# Patient Record
Sex: Female | Born: 1970
Health system: Southern US, Community
[De-identification: ages and names within clinical notes are randomized; demographics above are authoritative.]

## PROBLEM LIST (undated history)

## (undated) DIAGNOSIS — E282 Polycystic ovarian syndrome: Secondary | ICD-10-CM

## (undated) DIAGNOSIS — T7840XA Allergy, unspecified, initial encounter: Secondary | ICD-10-CM

## (undated) DIAGNOSIS — N83209 Unspecified ovarian cyst, unspecified side: Secondary | ICD-10-CM

## (undated) DIAGNOSIS — N946 Dysmenorrhea, unspecified: Secondary | ICD-10-CM

## (undated) HISTORY — DX: Allergy, unspecified, initial encounter: T78.40XA

## (undated) HISTORY — DX: Dysmenorrhea, unspecified: N94.6

## (undated) HISTORY — DX: Unspecified ovarian cyst, unspecified side: N83.209

## (undated) HISTORY — DX: Polycystic ovarian syndrome: E28.2

---

## 1995-07-21 HISTORY — PX: LAPAROSCOPIC ENDOMETRIOSIS FULGURATION: SUR769

## 2006-02-23 ENCOUNTER — Other Ambulatory Visit: Admission: RE | Admit: 2006-02-23 | Discharge: 2006-02-23 | Payer: Self-pay | Admitting: Obstetrics and Gynecology

## 2006-03-17 ENCOUNTER — Ambulatory Visit: Payer: Self-pay | Admitting: Family Medicine

## 2006-04-12 ENCOUNTER — Emergency Department (HOSPITAL_COMMUNITY): Admission: EM | Admit: 2006-04-12 | Discharge: 2006-04-12 | Payer: Self-pay | Admitting: Family Medicine

## 2006-04-25 ENCOUNTER — Emergency Department (HOSPITAL_COMMUNITY): Admission: EM | Admit: 2006-04-25 | Discharge: 2006-04-25 | Payer: Self-pay | Admitting: Family Medicine

## 2007-01-25 ENCOUNTER — Emergency Department (HOSPITAL_COMMUNITY): Admission: EM | Admit: 2007-01-25 | Discharge: 2007-01-25 | Payer: Self-pay | Admitting: Emergency Medicine

## 2007-08-14 ENCOUNTER — Emergency Department (HOSPITAL_COMMUNITY): Admission: EM | Admit: 2007-08-14 | Discharge: 2007-08-14 | Payer: Self-pay | Admitting: Family Medicine

## 2007-08-19 ENCOUNTER — Emergency Department (HOSPITAL_COMMUNITY): Admission: EM | Admit: 2007-08-19 | Discharge: 2007-08-19 | Payer: Self-pay | Admitting: Emergency Medicine

## 2007-08-31 ENCOUNTER — Encounter: Payer: Self-pay | Admitting: Internal Medicine

## 2007-09-23 ENCOUNTER — Ambulatory Visit: Payer: Self-pay | Admitting: Internal Medicine

## 2007-09-23 DIAGNOSIS — N83209 Unspecified ovarian cyst, unspecified side: Secondary | ICD-10-CM | POA: Insufficient documentation

## 2007-09-23 DIAGNOSIS — E782 Mixed hyperlipidemia: Secondary | ICD-10-CM | POA: Insufficient documentation

## 2007-09-23 DIAGNOSIS — N809 Endometriosis, unspecified: Secondary | ICD-10-CM | POA: Insufficient documentation

## 2007-09-23 DIAGNOSIS — E669 Obesity, unspecified: Secondary | ICD-10-CM | POA: Insufficient documentation

## 2007-09-23 LAB — CONVERTED CEMR LAB
ALT: 31 units/L (ref 0–35)
AST: 24 units/L (ref 0–37)
Basophils Relative: 0.3 % (ref 0.0–1.0)
Bilirubin, Direct: 0.2 mg/dL (ref 0.0–0.3)
CO2: 26 meq/L (ref 19–32)
Chloride: 109 meq/L (ref 96–112)
Creatinine, Ser: 0.8 mg/dL (ref 0.4–1.2)
Direct LDL: 165.2 mg/dL
Eosinophils Absolute: 0.1 10*3/uL (ref 0.0–0.6)
Eosinophils Relative: 1.2 % (ref 0.0–5.0)
Glucose, Bld: 79 mg/dL (ref 70–99)
Glucose, Urine, Semiquant: NEGATIVE
HCT: 44.4 % (ref 36.0–46.0)
MCV: 86.5 fL (ref 78.0–100.0)
Neutrophils Relative %: 61.5 % (ref 43.0–77.0)
Nitrite: NEGATIVE
RBC: 5.13 M/uL — ABNORMAL HIGH (ref 3.87–5.11)
Sodium: 141 meq/L (ref 135–145)
Specific Gravity, Urine: >=1.03
Total Bilirubin: 0.8 mg/dL (ref 0.3–1.2)
Total Protein: 6.9 g/dL (ref 6.0–8.3)
Triglycerides: 122 mg/dL (ref 0–149)
VLDL: 24 mg/dL (ref 0–40)
WBC: 9.6 10*3/uL (ref 4.5–10.5)

## 2007-10-04 ENCOUNTER — Encounter: Payer: Self-pay | Admitting: Internal Medicine

## 2007-10-27 ENCOUNTER — Ambulatory Visit: Payer: Self-pay | Admitting: Internal Medicine

## 2008-12-01 ENCOUNTER — Emergency Department (HOSPITAL_COMMUNITY): Admission: EM | Admit: 2008-12-01 | Discharge: 2008-12-01 | Payer: Self-pay | Admitting: Family Medicine

## 2009-11-19 ENCOUNTER — Emergency Department (HOSPITAL_COMMUNITY): Admission: EM | Admit: 2009-11-19 | Discharge: 2009-11-19 | Payer: Self-pay | Admitting: Emergency Medicine

## 2009-12-08 ENCOUNTER — Emergency Department (HOSPITAL_COMMUNITY): Admission: EM | Admit: 2009-12-08 | Discharge: 2009-12-08 | Payer: Self-pay | Admitting: Family Medicine

## 2010-08-13 ENCOUNTER — Ambulatory Visit (HOSPITAL_COMMUNITY)
Admission: RE | Admit: 2010-08-13 | Discharge: 2010-08-13 | Payer: Self-pay | Source: Home / Self Care | Attending: Family Medicine | Admitting: Family Medicine

## 2010-12-05 NOTE — Assessment & Plan Note (Signed)
[facility]                             [facility] [facility] OFFICE NOTE   NAME:[place], [place] Franklin                       MRN:          [id number]  DATE:03/17/2006                            DOB:          March 01, 1971    CHIEF COMPLAINT:  A 40 year old, white female here to establish new  physician.   HISTORY OF PRESENT ILLNESS:  Problem 1.  Chest pain:  [place] states that  she has been having chest pressure and tightness for approximately 1 year  off an on.  The pain, when it is occuring, is constant and is kind of deep  sustained ache that feels like she has one of her cats sitting on her chest.  She states that eating spicy foods and [place] food makes it worse.  Sometimes she has an increase with exercise.  There is no change with lying  flat.  Heat can sometimes help her chest pain.  She denies anxiety and has  occasionally been under stress.  She did move seven months ago from  [place] and has a new job.  When she has some of her chest pain, she has  some shortness of breath and nausea.  Prilosec over-the-counter did help  temporarily, but is not helping any longer.  She has not been on it for  approximately 1 week.   Problem 2.  Sore throat x1 week:  She states she has been having problems  with irritated throat and sneezing over the past few weeks.  She states  these are minor symptoms and she does not mind treating them over-the-  counter temporarily.   PAST MEDICAL HISTORY:  1. Endometriosis.  2. Ovarian cysts.  3. Hypoglycemia.  4. Wrist, ankle and knee pain.   PAST SURGICAL HISTORY:  1. In 1998, exploratory laparoscopy for endometriosis lasering.  2. Pap smear in July 2007, normal.  3. Tetanus in April 2007.   ALLERGIES:  No known drug allergies.   MEDICATIONS:  1. Generic Mircette daily.  2. Women's best friend two tablets daily.  3. Prilosec over-the-counter 20 mg daily.   REVIEW OF SYSTEMS:  She does have chronic bilateral  ankle, knee and right  wrist pain.  She states she has had injuries in all of these joints and  feels that the pain may be osteoarthritis in these areas.   FAMILY HISTORY:  Father alive at age 44, healthy.  Mother alive at age 77  with rheumatoid and osteoarthritis as well as diabetes.  She has no brothers  or sisters.  There is no family history of breast, ovarian, colon or uterine  cancer.  She does have a strong family history for high blood pressure,  diabetes and coronary artery disease on both sides.   SOCIAL HISTORY:  She works as a Nurse, learning disability.  She is single and not  currently in a relationship or sexually active.  She has no children.  She  does not get regular exercise currently, but is trying to increase her  exercise once she gets settled.  She has had a 30 pound weight gain over  the  past year.   NUTRITION:  She eats three to four meals daily including fruits and  vegetables and only occasional fast food.   PHYSICAL EXAMINATION:  VITAL SIGNS:  Height 5 feet 3.75 inches, weight 230  making BMI 40, blood pressure 111/68, pulse 55, temperature 98.0.  GENERAL:  Obese-appearing female in no apparent distress.  HEENT:  PERRLA.  Extraocular movements intact.  Tympanic membranes clear.  Oropharynx clear.  No thyromegaly.  No lymphadenopathy.  CARDIAC:  Regular rate and rhythm with no murmurs, rubs or gallops.  Normal  PMI, 2+ pulses peripherally.  CHEST:  Tenderness to palpation over lower portion of sternum and along  sternal borders.  PULMONARY:  Clear to auscultation bilaterally, no wheezes, rales or rhonchi.  ABDOMEN:  Soft, nontender, normoactive bowel sounds.  No hepatosplenomegaly.  PSYCHIATRIC:  Appropriate affect.  No suicidal or homicidal ideation.  Denies depressive symptoms.  MUSCULOSKELETAL:  Strength 5/5 in upper and lower extremities.  NEUROLOGIC:  Alert and oriented x3.  Cranial nerves 2-12 grossly intact.  Reflexes 2+.   ASSESSMENT/PLAN:  1. Chest pain,  acute.  This is most likely noncardiac chest pain.  Given      the fact that her chest wall is tender to palpation, it may be      secondary to costochondritis versus chest wall muscle strain.  She will      use anti-inflammatory over the next few days and p.r.n. pain.  She did      mention during the end of the visit, that Advil had helped with her      pain.  In addition, given her symptoms that sound like she may have      some gastroesophageal reflux disease, I will initiate her on Protonix      40 mg daily.  She will continue this and was given samples of this for      the next few weeks.  If it seems to be working, she can call for a      refill or may try Prilosec over-the-counter 40 mg daily.  If her      symptoms continue, we can consider further workup.  She appears to have      no pulmonary pathology at this point.  I did discuss with her that      losing weight certainly would improve her breathing.  2. Allergic rhinitis.  I feel that her sore throat is most likely      secondary to allergies.  At this point in time, she will use Claritin      over-the-counter and nasal saline.  She will return to clinic following      the receipt of her records to discuss her wrist, ankle and knee pain.                                  Kerby Nora, MD   AB/MedQ  DD:  03/17/2006  DT:  03/18/2006  Job #:  [id number]

## 2011-09-21 ENCOUNTER — Ambulatory Visit (INDEPENDENT_AMBULATORY_CARE_PROVIDER_SITE_OTHER): Payer: 59 | Admitting: Family Medicine

## 2011-09-21 VITALS — BP 140/86 | HR 76 | Temp 98.4°F | Resp 16 | Ht 64.0 in | Wt 226.0 lb

## 2011-09-21 DIAGNOSIS — J329 Chronic sinusitis, unspecified: Secondary | ICD-10-CM

## 2011-09-21 DIAGNOSIS — J019 Acute sinusitis, unspecified: Secondary | ICD-10-CM

## 2011-09-21 MED ORDER — IPRATROPIUM BROMIDE 0.03 % NA SOLN: 2.0000 | Freq: Four times a day (QID) | NASAL | Status: DC

## 2011-09-21 MED ORDER — CEFDINIR 300 MG PO CAPS: 300.0000 mg | ORAL_CAPSULE | Freq: Two times a day (BID) | ORAL | Status: AC

## 2011-09-21 NOTE — Progress Notes (Signed)
  Patient Name: [place] Date of Birth: 09-Sep-1970 Medical Record Number: [id number] Gender: female Date of Encounter: 09/21/2011  History of Present Illness:  [place] is a 41 y.o. very pleasant female patient who presents with the following:  Had a sinus infection about 2 weeks ago- treated at home with OTC supplements and medications.  Was getting better- then had visitors who brought in illness- about a week ago noted stomach pains and diarrhea/ vomiting.  These symptoms persisted for 3 or 4 days and then got better. Fever, N/V and diarrhea resolved.   Then yesterday her sinus symptoms returned with copious nasal discharge and cough.   Using OTC medications.  Has some PND which is causing nausea.  Tends to get bronchitis and pneumonia easily.  No current fever, does have a ST, ears feel congested.  Cough is productive of clear mucus.  Still has sinus pressure and pain, also tooth pain.   No change of pregnancy  Patient Active Problem List  Diagnoses  . HYPERLIPIDEMIA  . OBESITY, UNSPECIFIED  . ENDOMETRIOSIS, SITE UNSPECIFIED  . OVARIAN CYST   Past Medical History  Diagnosis Date  . Allergy   . Endometriosis    Past Surgical History  Procedure Date  . Laparoscopic endometriosis fulguration 1997   History  Substance Use Topics  . Smoking status: Never Smoker   . Smokeless tobacco: Never Used  . Alcohol Use: No   History reviewed. No pertinent family history. Allergies  Allergen Reactions  . Prednisone     Medication list has been reviewed and updated.  Review of Systems: As per HPI- otherwise negative.   Physical Examination: Filed Vitals:   09/21/11 0925  BP: 140/86  Pulse: 76  Temp: 98.4 F (36.9 C)  TempSrc: Oral  Resp: 16  Height: 5\' 4"  (1.626 m)  Weight: 226 lb (102.513 kg)    Body mass index is 38.79 kg/(m^2).  GEN: WDWN, NAD, Non-toxic, A & O x 3, obese HEENT: Atraumatic, Normocephalic. Neck supple. No masses, No LAD.  TM wnl  bilaterally, oropharynx wnl, sinuses congested and tender Ears and Nose: No external deformity. CV: RRR, No M/G/R. No JVD. No thrill. No extra heart sounds. PULM: CTA B, no wheezes, crackles, rhonchi. No retractions. No resp. distress. No accessory muscle use. ABD: S, NT, ND, +BS. No rebound. No HSM. EXTR: No c/c/e NEURO Normal gait.  PSYCH: Normally interactive. Conversant. Not depressed or anxious appearing.  Calm demeanor.    Assessment and Plan: 1. Sinusitis  cefdinir (OMNICEF) 300 MG capsule, ipratropium (ATROVENT) 0.03 % nasal spray    Treat sinusitis as above.

## 2011-12-02 ENCOUNTER — Ambulatory Visit (INDEPENDENT_AMBULATORY_CARE_PROVIDER_SITE_OTHER): Payer: 59 | Admitting: Family Medicine

## 2011-12-02 VITALS — BP 124/79 | HR 85 | Temp 98.3°F | Resp 16 | Ht 64.0 in | Wt 229.8 lb

## 2011-12-02 DIAGNOSIS — R059 Cough, unspecified: Secondary | ICD-10-CM

## 2011-12-02 DIAGNOSIS — J329 Chronic sinusitis, unspecified: Secondary | ICD-10-CM

## 2011-12-02 DIAGNOSIS — R05 Cough: Secondary | ICD-10-CM

## 2011-12-02 DIAGNOSIS — J019 Acute sinusitis, unspecified: Secondary | ICD-10-CM

## 2011-12-02 NOTE — Progress Notes (Signed)
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  Comes in with 1 week of progressive sinus congestion. She's had this in the past has done very well with Ceftin air. She's already tried a number of over-the-counter products which have not helped her. She now complains about sinus congestion, headache, sore throat, and cough.  She's had no fever, shortness of breath, epistaxis, or stiff neck.  Objective: Overweight woman in no acute distress, very pleasant to talk with  HEENT: Unremarkable with the exception mucopurulent discharge in both nasal passages.  Chest: Clear  Heart: Regular, no murmur  Assessment: Sinusitis, acute  Plan: Ceftin ear 300 mg #22 daily.

## 2011-12-02 NOTE — Patient Instructions (Signed)

## 2011-12-04 ENCOUNTER — Telehealth: Payer: Self-pay

## 2011-12-04 NOTE — Telephone Encounter (Signed)
Pt calling she is not getting better but worse after being on antibiotics she would like to see if Dr could call something else in. [facility]

## 2011-12-05 ENCOUNTER — Ambulatory Visit (INDEPENDENT_AMBULATORY_CARE_PROVIDER_SITE_OTHER): Payer: 59 | Admitting: Family Medicine

## 2011-12-05 VITALS — BP 114/76 | HR 76 | Temp 97.8°F | Resp 18 | Ht 64.0 in | Wt 229.0 lb

## 2011-12-05 DIAGNOSIS — R059 Cough, unspecified: Secondary | ICD-10-CM

## 2011-12-05 DIAGNOSIS — R05 Cough: Secondary | ICD-10-CM

## 2011-12-05 DIAGNOSIS — J329 Chronic sinusitis, unspecified: Secondary | ICD-10-CM

## 2011-12-05 MED ORDER — LEVOFLOXACIN 500 MG PO TABS: 500.0000 mg | ORAL_TABLET | Freq: Every day | ORAL | Status: AC

## 2011-12-05 MED ORDER — MOMETASONE FURO-FORMOTEROL FUM 200-5 MCG/ACT IN AERO: 2.0000 | INHALATION_SPRAY | Freq: Two times a day (BID) | RESPIRATORY_TRACT | Status: DC

## 2011-12-05 NOTE — Telephone Encounter (Signed)
PT IS LOSING VOICE, HEADACHE, COUGHING UP BIG CHUNKS OF PHLEGM.Marland KitchenMarland KitchenTHIS IS GETTING WORSE SINCE SHE WAS HERE ON Wednesday. WHAT ELSE CAN SHE DO NOW?

## 2011-12-05 NOTE — Telephone Encounter (Signed)
PT DID NOT ANSWER. AGAIN.

## 2011-12-05 NOTE — Patient Instructions (Signed)

## 2011-12-05 NOTE — Telephone Encounter (Signed)
LMOM TO CB 

## 2011-12-05 NOTE — Progress Notes (Signed)
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 She comes in now 3 days after she was seen for sinus infection. The phlegm is now clear but has copious emesis of the deep cough and hoarseness. She still feels like her sinuses are full and she is off balance at times. There no fever or hemoptysis.  Patient had a bad reaction to prednisone in the past.  Objective: Overweight woman in no acute distress, pleasant and cooperative  HEENT: Unchanged from last visit-copious mucus in both nasal passages  Neck: Supple no adenopathy  Chest: Clear no rhonchi or wheezes  Skin: Clear  Assessment: Sinusitis. Patient has not responded to the Ceftin her like we hope.  Plan: Do r inhaler twice a day and Levaquin 500 daily x7

## 2011-12-05 NOTE — Telephone Encounter (Signed)
Pt returning clinical phone call and she states she is still taking meds and not getting any better. [phone]

## 2011-12-06 NOTE — Telephone Encounter (Signed)
Patient was seen yesterday, before we were able to reach her by phone for the problem.

## 2012-02-02 ENCOUNTER — Ambulatory Visit: Payer: 59 | Admitting: Physician Assistant

## 2012-04-28 ENCOUNTER — Ambulatory Visit (INDEPENDENT_AMBULATORY_CARE_PROVIDER_SITE_OTHER): Payer: 59 | Admitting: Physician Assistant

## 2012-04-28 ENCOUNTER — Encounter: Payer: Self-pay | Admitting: Physician Assistant

## 2012-04-28 VITALS — BP 130/90 | HR 62 | Temp 98.0°F | Resp 16 | Ht 63.5 in | Wt 239.0 lb

## 2012-04-28 DIAGNOSIS — N83209 Unspecified ovarian cyst, unspecified side: Secondary | ICD-10-CM | POA: Insufficient documentation

## 2012-04-28 DIAGNOSIS — N946 Dysmenorrhea, unspecified: Secondary | ICD-10-CM

## 2012-04-28 DIAGNOSIS — M25519 Pain in unspecified shoulder: Secondary | ICD-10-CM

## 2012-04-28 DIAGNOSIS — J309 Allergic rhinitis, unspecified: Secondary | ICD-10-CM

## 2012-04-28 DIAGNOSIS — J45909 Unspecified asthma, uncomplicated: Secondary | ICD-10-CM

## 2012-04-28 MED ORDER — NORGESTIMATE-ETH ESTRADIOL 0.25-35 MG-MCG PO TABS: 1.0000 | ORAL_TABLET | Freq: Every day | ORAL | Status: DC

## 2012-04-28 MED ORDER — MONTELUKAST SODIUM 10 MG PO TABS: 10.0000 mg | ORAL_TABLET | Freq: Every day | ORAL | Status: DC

## 2012-04-28 MED ORDER — IPRATROPIUM BROMIDE 0.03 % NA SOLN: 2.0000 | Freq: Four times a day (QID) | NASAL | Status: DC

## 2012-04-28 MED ORDER — FLUTICASONE PROPIONATE 50 MCG/ACT NA SUSP: 2.0000 | Freq: Every day | NASAL | Status: DC

## 2012-04-28 MED ORDER — MOMETASONE FURO-FORMOTEROL FUM 200-5 MCG/ACT IN AERO: 2.0000 | INHALATION_SPRAY | Freq: Two times a day (BID) | RESPIRATORY_TRACT | Status: DC

## 2012-04-28 MED ORDER — ALBUTEROL SULFATE HFA 108 (90 BASE) MCG/ACT IN AERS: 2.0000 | INHALATION_SPRAY | RESPIRATORY_TRACT | Status: DC | PRN
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
## 2012-04-29 ENCOUNTER — Telehealth: Payer: Self-pay

## 2012-04-29 ENCOUNTER — Ambulatory Visit (INDEPENDENT_AMBULATORY_CARE_PROVIDER_SITE_OTHER): Payer: 59 | Admitting: Physician Assistant

## 2012-04-29 VITALS — BP 148/98 | HR 110 | Temp 98.6°F | Resp 17 | Ht 64.0 in | Wt 238.0 lb

## 2012-04-29 DIAGNOSIS — J019 Acute sinusitis, unspecified: Secondary | ICD-10-CM

## 2012-04-29 MED ORDER — CEFDINIR 300 MG PO CAPS: 600.0000 mg | ORAL_CAPSULE | Freq: Every day | ORAL | Status: DC

## 2012-04-29 MED ORDER — HYDROCOD POLST-CHLORPHEN POLST 10-8 MG/5ML PO LQCR: 5.0000 mL | Freq: Two times a day (BID) | ORAL | Status: DC | PRN

## 2012-04-29 NOTE — Patient Instructions (Signed)
Get plenty of rest and drink at least 64 ounces of water daily. 

## 2012-04-29 NOTE — Telephone Encounter (Signed)
PT STATES SHE HAD SEEN CHELLE AND WAS CALLED IN SOME MEDICINES, BUT SHE STILL HAVE A SORE THROAT AND COUGH AND THEY FORGOT TO GIVE HER SOMETHING FOR IT PLEASE CALL [phone]     PHARMACY

## 2012-04-29 NOTE — Progress Notes (Signed)
  Subjective:    Patient ID: [place], female    DOB: 1971/06/30, 41 y.o.   MRN: [id number]  HPI  This 41 y.o. female presents for evaluation of sinusitis.  I saw her yesterday and we changed her regimen for asthma and allergies in hopes of preventing just this.  She is concerned that not treating this now will result is worsening symptoms, asthma exacerbation, and bronchitis, which she's had previously.  Review of Systems As above.    Objective:   Physical Exam Blood pressure 148/98, pulse 110, temperature 98.6 F (37 C), temperature source Oral, resp. rate 17, height 5\' 4"  (1.626 m), weight 238 lb (107.956 kg), last menstrual period 01/15/2012, SpO2 96.00%. Body mass index is 40.85 kg/(m^2). Well-developed, well nourished WF who is awake, alert and oriented, in NAD. HEENT: [place]/AT, PERRL, EOMI.  Sclera and conjunctiva are clear.  EAC are patent, TMs are normal in appearance. Nasal mucosa is pink and moist, with mild congestion. OP is clear. Frontal and maxillary sinuses are exquisitely tender. Neck: supple, non-tender, no lymphadenopathy, thyromegaly. Heart: RRR, no murmur Lungs: normal effort, CTA Abdomen: normo-active bowel sounds, supple, non-tender, no mass or organomegaly. Extremities: no cyanosis, clubbing or edema. Skin: warm and dry without rash. Psychologic: good mood and appropriate affect, normal speech and behavior.     Assessment & Plan:   1. Acute sinusitis, unspecified  cefdinir (OMNICEF) 300 MG capsule

## 2012-04-29 NOTE — Telephone Encounter (Signed)
Rx printed.  Please call/fax to the pharmacy of her choice.

## 2012-04-30 NOTE — Telephone Encounter (Signed)
Left message to return call. Confirm pharmacy and call or fax.

## 2012-05-02 NOTE — Telephone Encounter (Signed)
lmom that rx was sent into [facility] pharmacy

## 2012-05-18 ENCOUNTER — Ambulatory Visit (INDEPENDENT_AMBULATORY_CARE_PROVIDER_SITE_OTHER): Payer: 59 | Admitting: Emergency Medicine

## 2012-05-18 VITALS — BP 118/78 | HR 99 | Temp 98.6°F | Resp 17 | Ht 65.0 in | Wt 238.0 lb

## 2012-05-18 DIAGNOSIS — J329 Chronic sinusitis, unspecified: Secondary | ICD-10-CM

## 2012-05-18 MED ORDER — HYDROCOD POLST-CHLORPHEN POLST 10-8 MG/5ML PO LQCR: 5.0000 mL | Freq: Two times a day (BID) | ORAL | Status: DC | PRN

## 2012-05-18 MED ORDER — LEVOFLOXACIN 500 MG PO TABS: 500.0000 mg | ORAL_TABLET | Freq: Every day | ORAL | Status: AC

## 2012-05-18 NOTE — Progress Notes (Signed)
  Subjective:    Patient ID: Nancy [place], female    DOB: 01/09/1971, 41 y.o.   MRN: [id number]  HPI  Here for follow up on sinus issues.  Having pain in the face and head.  Went to the dentist because of pain in upper teeth.  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  Seen chelle for this before.  Patient gets loopy on prednisone.  Very prone to getting sinus issues, bronchitis and pneumonia.  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  Wheezing has gone away.      Review of Systems     Objective:   Physical Exam TMs are clear. The nose is congested. Posterior pharynx is clear. The neck is supple without adenopathy. Chest was clear to both auscultation and percussion.        Assessment & Plan:  Patient here with an acute sinusitis which did not clear completely with a trial of omnicef.. Will treat with Levaquin 500 one a day for 10 days

## 2012-05-18 NOTE — Patient Instructions (Addendum)

## 2012-07-22 ENCOUNTER — Ambulatory Visit: Payer: Self-pay

## 2012-07-22 ENCOUNTER — Other Ambulatory Visit: Payer: Self-pay | Admitting: Occupational Medicine

## 2012-07-22 DIAGNOSIS — M25519 Pain in unspecified shoulder: Secondary | ICD-10-CM

## 2012-08-03 ENCOUNTER — Ambulatory Visit
Payer: PRIVATE HEALTH INSURANCE | Attending: Occupational Medicine | Admitting: Rehabilitative and Restorative Service Providers"

## 2012-08-03 DIAGNOSIS — IMO0001 Reserved for inherently not codable concepts without codable children: Secondary | ICD-10-CM | POA: Insufficient documentation

## 2012-08-03 DIAGNOSIS — M25519 Pain in unspecified shoulder: Secondary | ICD-10-CM | POA: Insufficient documentation

## 2012-08-03 DIAGNOSIS — M25619 Stiffness of unspecified shoulder, not elsewhere classified: Secondary | ICD-10-CM | POA: Insufficient documentation

## 2012-08-08 ENCOUNTER — Ambulatory Visit: Payer: PRIVATE HEALTH INSURANCE | Attending: Occupational Medicine | Admitting: Physical Therapy

## 2012-08-08 DIAGNOSIS — M25619 Stiffness of unspecified shoulder, not elsewhere classified: Secondary | ICD-10-CM | POA: Insufficient documentation

## 2012-08-08 DIAGNOSIS — M25519 Pain in unspecified shoulder: Secondary | ICD-10-CM | POA: Insufficient documentation

## 2012-08-08 DIAGNOSIS — IMO0001 Reserved for inherently not codable concepts without codable children: Secondary | ICD-10-CM | POA: Insufficient documentation

## 2012-08-15 ENCOUNTER — Ambulatory Visit: Payer: PRIVATE HEALTH INSURANCE | Attending: Physician Assistant | Admitting: Physical Therapy

## 2012-08-15 DIAGNOSIS — IMO0001 Reserved for inherently not codable concepts without codable children: Secondary | ICD-10-CM | POA: Insufficient documentation

## 2012-08-15 DIAGNOSIS — M25519 Pain in unspecified shoulder: Secondary | ICD-10-CM | POA: Insufficient documentation

## 2012-08-15 DIAGNOSIS — M25619 Stiffness of unspecified shoulder, not elsewhere classified: Secondary | ICD-10-CM | POA: Insufficient documentation

## 2012-08-18 ENCOUNTER — Ambulatory Visit: Payer: PRIVATE HEALTH INSURANCE | Admitting: Physical Therapy

## 2012-08-22 ENCOUNTER — Ambulatory Visit: Payer: PRIVATE HEALTH INSURANCE | Attending: Occupational Medicine | Admitting: Physical Therapy

## 2012-08-22 DIAGNOSIS — M25519 Pain in unspecified shoulder: Secondary | ICD-10-CM | POA: Insufficient documentation

## 2012-08-22 DIAGNOSIS — IMO0001 Reserved for inherently not codable concepts without codable children: Secondary | ICD-10-CM | POA: Insufficient documentation

## 2012-08-22 DIAGNOSIS — M25619 Stiffness of unspecified shoulder, not elsewhere classified: Secondary | ICD-10-CM | POA: Insufficient documentation

## 2012-08-24 ENCOUNTER — Ambulatory Visit: Payer: PRIVATE HEALTH INSURANCE | Admitting: Rehabilitative and Restorative Service Providers"

## 2012-08-29 ENCOUNTER — Ambulatory Visit: Payer: PRIVATE HEALTH INSURANCE | Attending: Occupational Medicine | Admitting: Physical Therapy

## 2012-08-29 DIAGNOSIS — IMO0001 Reserved for inherently not codable concepts without codable children: Secondary | ICD-10-CM | POA: Insufficient documentation

## 2012-08-29 DIAGNOSIS — M25519 Pain in unspecified shoulder: Secondary | ICD-10-CM | POA: Insufficient documentation

## 2012-08-29 DIAGNOSIS — M25619 Stiffness of unspecified shoulder, not elsewhere classified: Secondary | ICD-10-CM | POA: Insufficient documentation

## 2012-09-05 ENCOUNTER — Ambulatory Visit: Payer: PRIVATE HEALTH INSURANCE | Attending: Occupational Medicine | Admitting: Physical Therapy

## 2012-09-05 DIAGNOSIS — IMO0001 Reserved for inherently not codable concepts without codable children: Secondary | ICD-10-CM | POA: Insufficient documentation

## 2012-09-05 DIAGNOSIS — M25519 Pain in unspecified shoulder: Secondary | ICD-10-CM | POA: Insufficient documentation

## 2012-09-05 DIAGNOSIS — M25619 Stiffness of unspecified shoulder, not elsewhere classified: Secondary | ICD-10-CM | POA: Insufficient documentation

## 2012-09-22 ENCOUNTER — Telehealth: Payer: Self-pay | Admitting: Radiology

## 2012-09-22 DIAGNOSIS — N946 Dysmenorrhea, unspecified: Secondary | ICD-10-CM

## 2012-09-22 NOTE — Telephone Encounter (Signed)
[facility] has sent a FAX about patients Mono-Linyah 28 patient takes continuously please advise. Can we refill? Can we put this on the Sig?

## 2012-09-22 NOTE — Telephone Encounter (Signed)
[facility] has sent a FAX,for Mono -Linyah 28  patient states she is taking continuously please send a new Rx with this sig. Amy

## 2012-09-24 MED ORDER — NORGESTIMATE-ETH ESTRADIOL 0.25-35 MG-MCG PO TABS: 1.0000 | ORAL_TABLET | Freq: Every day | ORAL | Status: DC

## 2012-09-24 NOTE — Telephone Encounter (Signed)
Yes.  Please change the sig to 1 PO QD continuous use.

## 2012-09-24 NOTE — Telephone Encounter (Signed)
This is done.

## 2012-11-24 ENCOUNTER — Ambulatory Visit: Payer: 59 | Admitting: Physician Assistant

## 2012-12-01 ENCOUNTER — Ambulatory Visit: Payer: BC Managed Care – PPO | Admitting: Physician Assistant

## 2012-12-01 ENCOUNTER — Encounter: Payer: Self-pay | Admitting: Physician Assistant

## 2012-12-01 VITALS — BP 122/73 | HR 77 | Temp 98.3°F | Resp 17 | Wt 244.0 lb

## 2012-12-01 DIAGNOSIS — N809 Endometriosis, unspecified: Secondary | ICD-10-CM

## 2012-12-01 MED ORDER — NORETHINDRONE-ETH ESTRADIOL 1-35 MG-MCG PO TABS: 1.0000 | ORAL_TABLET | Freq: Every day | ORAL | Status: DC

## 2012-12-01 MED ORDER — LEVONORGESTREL-ETHINYL ESTRAD 0.15-30 MG-MCG PO TABS: 1.0000 | ORAL_TABLET | Freq: Every day | ORAL | Status: DC

## 2012-12-01 NOTE — Progress Notes (Signed)
  Subjective:    Patient ID: Nancy [place], female    DOB: 1970/12/22, 42 y.o.   MRN: [id number]  HPI  I need to get my birth control pills under control.  What I'm using isn't working.  When she was losing insurance, we changed her to Sprintec, which was more affordable.  I've pretty much had breakthrough bleeding since October. Flow requires 2 tampons daily, and then every 2 weeks, increases requiring 4-5 tampons/day (this typically occurs when she's spending time with another woman who is menstruating. Cramping is tolerable, though more than usual.  Initially started on coc to control endometriosis. Likes Librel (but no longer available, and generic Amythest was associated with pelvic pain).  Then tried mononessa, Tax adviser, Patent attorney (generic Mircette). Comparing ingredients, Aviane is similar to Librel (0.1/20). However, she also asks for "a much higher dose" than what she's been on (0.25/30)  Review of Systems As above.  No URI-type symptoms, allergies are well controlled.  No chest pain, SOB, HA, dizziness, vision change, N/V, diarrhea, constipation, dysuria, urinary urgency or frequency, myalgias, arthralgias or rash.     Objective:   Physical Exam  Blood pressure 122/73, pulse 77, temperature 98.3 F (36.8 C), temperature source Oral, resp. rate 17, weight 244 lb (110.678 kg), last menstrual period 12/01/2012. Body mass index is 40.6 kg/(m^2). Well-developed, well nourished obese WF who is awake, alert and oriented, in NAD. HEENT: [place]/AT, sclera and conjunctiva are clear.   Neck: supple, non-tender, no lymphadenopathy, thyromegaly. Heart: RRR, no murmur Lungs: normal effort, CTA Extremities: no cyanosis, clubbing or edema. Skin: warm and dry without rash. Psychologic: good mood and appropriate affect, normal speech and behavior.       Assessment & Plan:  Endometriosis, site unspecified - Plan: norethindrone-ethinyl estradiol 1/35 (ORTHO-NOVUM, NORTREL,CYCLAFEM) tablet;  Reassess in 3-6 months.  Fernande Bras, PA-C Physician Assistant-Certified [facility]

## 2012-12-01 NOTE — Patient Instructions (Signed)
Give this product a 3 month trial.  If it's working, then continue it.  If it's not working, or you develop new problems with it, please let me know!

## 2013-04-03 ENCOUNTER — Telehealth: Payer: Self-pay

## 2013-04-03 NOTE — Telephone Encounter (Signed)
Left message on machine to call back  

## 2013-04-03 NOTE — Telephone Encounter (Signed)
Called patient, she wants what? She wants refill of her ocp sent in for her, she will take continuously while she is on vacation. Pharmacy asked her to call here for vacation rx

## 2013-04-03 NOTE — Telephone Encounter (Signed)
On 12/01/2012, I wrote from 3 packages, RF x 4.  She shouldn't be out of refills.  If what she needs is an early fill, that's OK with me, please advise the pharmacist.

## 2013-04-03 NOTE — Telephone Encounter (Signed)
Patient needs a refill written for dasetta so that she can take it on vacation with her.  Please call [phone]

## 2013-04-03 NOTE — Telephone Encounter (Signed)
Pt notified that she has to call her ins to give her pharmacy an override for vacation supply.

## 2013-04-24 ENCOUNTER — Ambulatory Visit: Payer: BC Managed Care – PPO | Admitting: Family Medicine

## 2013-04-24 VITALS — BP 112/86 | HR 79 | Temp 97.9°F | Resp 16 | Wt 239.0 lb

## 2013-04-24 DIAGNOSIS — N809 Endometriosis, unspecified: Secondary | ICD-10-CM

## 2013-04-24 DIAGNOSIS — J040 Acute laryngitis: Secondary | ICD-10-CM

## 2013-04-24 MED ORDER — NORETHINDRONE-ETH ESTRADIOL 1-35 MG-MCG PO TABS: 1.0000 | ORAL_TABLET | Freq: Every day | ORAL | Status: DC

## 2013-04-24 MED ORDER — LEVOFLOXACIN 500 MG PO TABS: 500.0000 mg | ORAL_TABLET | Freq: Every day | ORAL | Status: DC

## 2013-04-24 MED ORDER — HYDROCOD POLST-CHLORPHEN POLST 10-8 MG/5ML PO LQCR: 5.0000 mL | Freq: Two times a day (BID) | ORAL | Status: DC | PRN

## 2013-04-24 NOTE — Progress Notes (Signed)
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  No difficulty breathing. She was at the [place] story telling festival this weekend in the rain and cold.  Mostly clear discharge. No sore throat.  Objective:  NAD HEENT:  Unremarkable Chest:  Clear Heart: reg, no murmur  Assessment:  Viral laryngitis.  Plan:  tussionex If symptoms do not clear or worsen over next few days, Rx Angelyn Punt, MD

## 2013-05-03 ENCOUNTER — Telehealth: Payer: Self-pay

## 2013-05-03 NOTE — Telephone Encounter (Signed)
Called her back to advise. She is advised to continue the Mucinex. She indicates she is getting worse. I have advised her to return to clinic, since she describes fever, aching, worsening of symptoms. She states she is not sure she can return, because of finances. She wants to know if you will renew her cough meds, please advise.

## 2013-05-03 NOTE — Telephone Encounter (Signed)
I agree.  Since she is worse, she needs to return for further evaluation

## 2013-05-03 NOTE — Telephone Encounter (Signed)
Patient calling because she was seen last week for a cough and illness. She has finished her tussinex (cough syrup) and antibiotic that was given on the 04/24/13. Patient only reports symptoms of coughing up clear large amounts of phlegm. She feels like if she can have some more cough syrup and medication it can get rid of this cold. Patient see's Chelle usually but she last saw [place]. If someone could please look to see if we can give her some medication without having to make an office visit because its not severe. Thank you!  Best number: [phone]- please call patient to let her know that it is possible to pick up these medications.   Pharmacy- [facility].

## 2013-05-04 NOTE — Telephone Encounter (Signed)
Called patient to advise, Dr Milus Glazier would like to see her before the cough medication can be renewed.

## 2013-07-18 ENCOUNTER — Ambulatory Visit: Payer: BC Managed Care – PPO

## 2013-07-18 ENCOUNTER — Ambulatory Visit: Payer: BC Managed Care – PPO | Admitting: Emergency Medicine

## 2013-07-18 VITALS — BP 128/84 | HR 82 | Temp 98.1°F | Resp 18 | Ht 64.0 in | Wt 238.0 lb

## 2013-07-18 DIAGNOSIS — S9031XA Contusion of right foot, initial encounter: Secondary | ICD-10-CM

## 2013-07-18 DIAGNOSIS — M25571 Pain in right ankle and joints of right foot: Secondary | ICD-10-CM

## 2013-07-18 DIAGNOSIS — S9030XA Contusion of unspecified foot, initial encounter: Secondary | ICD-10-CM

## 2013-07-18 DIAGNOSIS — M25579 Pain in unspecified ankle and joints of unspecified foot: Secondary | ICD-10-CM

## 2013-07-18 MED ORDER — NAPROXEN SODIUM 550 MG PO TABS: 550.0000 mg | ORAL_TABLET | Freq: Two times a day (BID) | ORAL | Status: AC

## 2013-07-18 NOTE — Patient Instructions (Signed)

## 2013-07-18 NOTE — Progress Notes (Signed)
[facility] [address] [place] [address] [phone]  Date:  07/18/2013   Name:  Nancy Franklin   DOB:  1971-07-16   MRN:  [id number]  PCP:  Sherre Wooton,CHELLE, PA-C    Chief Complaint: Foot Injury   History of Present Illness:  Nancy Franklin is a 42 y.o. very pleasant female patient who presents with the following:  Injured her right foot when she dropped an ottoman on her foot while moving.  Swelled and ecchymotic.   Improved and now has worsened.  No history of re-injury.  Pain with ambulation.  Cannot bend great toe.  No improvement with over the counter medications or other home remedies. Denies other complaint or health concern today.   Patient Active Problem List   Diagnosis Date Noted  . HYPERLIPIDEMIA 09/23/2007  . OBESITY, UNSPECIFIED 09/23/2007  . ENDOMETRIOSIS, SITE UNSPECIFIED 09/23/2007  . OVARIAN CYST 09/23/2007    Past Medical History  Diagnosis Date  . Allergy   . Endometriosis   . Ovarian cyst     Past Surgical History  Procedure Laterality Date  . Laparoscopic endometriosis fulguration  1997    History  Substance Use Topics  . Smoking status: Never Smoker   . Smokeless tobacco: Never Used  . Alcohol Use: No    Family History  Problem Relation Age of Onset  . Diabetes Mother   . Arthritis Mother     Rheumatoid and Osteoarthritis  . Heart disease Paternal Grandfather     Allergies  Allergen Reactions  . Latex   . Prednisone Other (See Comments)    Light headness, dizzy    Medication list has been reviewed and updated.  Current Outpatient Prescriptions on File Prior to Visit  Medication Sig Dispense Refill  . albuterol (PROVENTIL HFA;VENTOLIN HFA) 108 (90 BASE) MCG/ACT inhaler Inhale 2 puffs into the lungs every 4 (four) hours as needed for wheezing (cough, shortness of breath or wheezing.).  3 Inhaler  0  . fluticasone (FLONASE) 50 MCG/ACT nasal spray Place 2 sprays into the nose daily.  54 g  3  .  ipratropium (ATROVENT) 0.03 % nasal spray Place 2 sprays into the nose 4 (four) times daily.  90 mL  3  . montelukast (SINGULAIR) 10 MG tablet Take 1 tablet (10 mg total) by mouth at bedtime.  90 tablet  3  . norethindrone-ethinyl estradiol 1/35 (ORTHO-NOVUM, NORTREL,CYCLAFEM) tablet Take 1 tablet by mouth daily. Skip withdrawal bleed; take continuously.  3 Package  4  . chlorpheniramine-HYDROcodone (TUSSIONEX PENNKINETIC ER) 10-8 MG/5ML LQCR Take 5 mLs by mouth every 12 (twelve) hours as needed.  90 mL  0  . levofloxacin (LEVAQUIN) 500 MG tablet Take 1 tablet (500 mg total) by mouth daily.  7 tablet  0   No current facility-administered medications on file prior to visit.    Review of Systems:  As per HPI, otherwise negative.    Physical Examination: Filed Vitals:   07/18/13 0951  BP: 128/84  Pulse: 82  Temp: 98.1 F (36.7 C)  Resp: 18   Filed Vitals:   07/18/13 0951  Height: 5\' 4"  (1.626 m)  Weight: 238 lb (107.956 kg)   Body mass index is 40.83 kg/(m^2). Ideal Body Weight: Weight in (lb) to have BMI = 25: 145.3   GEN: WDWN, NAD, Non-toxic, Alert & Oriented x 3 HEENT: Atraumatic, Normocephalic.  Ears and Nose: No external deformity. EXTR: No clubbing/cyanosis/edema NEURO: Normal gait.  PSYCH: Normally interactive. Conversant. Not  depressed or anxious appearing.  Calm demeanor.  RIGHT foot:  Ecchymosis great toe with tenderness.  Bruising of second and third toes.  No deformity.  Assessment and Plan: Contusion foot Anaprox Elevate local heat   Signed,  Phillips Odor, MD   [facility] reading (PRIMARY) by  Dr. Dareen Piano.  Negative foot .

## 2014-04-13 ENCOUNTER — Other Ambulatory Visit: Payer: Self-pay

## 2014-04-13 DIAGNOSIS — N809 Endometriosis, unspecified: Secondary | ICD-10-CM

## 2014-04-13 NOTE — Telephone Encounter (Signed)
Chelle, pt left message on my VM asking for RFs on her BCP. She stated that some family emergencies, and other things, have kept her in and out of town a lot lately. She stated she is doing well on the current Rx and wanted to ask Chelle if she would RF 3 more packs to cover her until she could set up appt after first of the year for your CPE. Pended for review.

## 2014-04-16 MED ORDER — NORETHINDRONE-ETH ESTRADIOL 1-35 MG-MCG PO TABS: 1.0000 | ORAL_TABLET | Freq: Every day | ORAL | Status: DC

## 2014-04-16 NOTE — Telephone Encounter (Signed)
I've sent in her pills.  Has she seen GYN in the past year for breast exam? We haven't done a pap test on her (at least not since our change to the EMR). Depending on when it was last done, she may need an update.

## 2014-04-16 NOTE — Telephone Encounter (Signed)
Notified pt done and discussed f/up for breast exam and pap. She stated she will get records of these and bring in to OV with her so that we can update her chart and Chelle can see what needs to be done.

## 2015-03-12 ENCOUNTER — Ambulatory Visit (INDEPENDENT_AMBULATORY_CARE_PROVIDER_SITE_OTHER): Payer: 59 | Admitting: Physician Assistant

## 2015-03-12 ENCOUNTER — Encounter: Payer: Self-pay | Admitting: Physician Assistant

## 2015-03-12 VITALS — BP 116/90 | HR 100 | Temp 99.0°F | Resp 16 | Ht 63.75 in | Wt 248.0 lb

## 2015-03-12 DIAGNOSIS — J3089 Other allergic rhinitis: Secondary | ICD-10-CM

## 2015-03-12 DIAGNOSIS — Z23 Encounter for immunization: Secondary | ICD-10-CM

## 2015-03-12 DIAGNOSIS — N809 Endometriosis, unspecified: Secondary | ICD-10-CM

## 2015-03-12 DIAGNOSIS — Z114 Encounter for screening for human immunodeficiency virus [HIV]: Secondary | ICD-10-CM | POA: Diagnosis not present

## 2015-03-12 MED ORDER — NORETHINDRONE-ETH ESTRADIOL 1-35 MG-MCG PO TABS: 1.0000 | ORAL_TABLET | Freq: Every day | ORAL | Status: DC

## 2015-03-12 MED ORDER — LORATADINE-PSEUDOEPHEDRINE ER 10-240 MG PO TB24: 1.0000 | ORAL_TABLET | Freq: Every day | ORAL | Status: DC

## 2015-03-12 MED ORDER — AMOXICILLIN-POT CLAVULANATE 875-125 MG PO TABS: 1.0000 | ORAL_TABLET | Freq: Two times a day (BID) | ORAL | Status: AC

## 2015-03-12 NOTE — Patient Instructions (Signed)
I will contact you with your lab results as soon as they are available.   If you have not heard from me in 2 weeks, please contact me.  The fastest way to get your results is to register for My Chart (see the instructions on the last page of this printout).  If your symptoms persist, we can try a quinolone antibiotic. If that doesn't work either, you'll need a CT scan of the sinuses.

## 2015-03-12 NOTE — Progress Notes (Signed)
Patient ID: NATASIA SANKO, female    DOB: 1970-09-03, 44 y.o.   MRN: [id number]  PCP: Olene Floss  Subjective:   Chief Complaint  Patient presents with  . Headache  . nasal congestion    blowing clear to green mucus suffering all summer with allergies  . ears    feel "clogged"    HPI Presents for evaluation of her chronic allergies, worse for the past several months.  She describes laryngitis 2-3 times/week, lots of mucous and cough. Her teeth tingle and she gets a pounding headache at the temples. Cough is occasionally productive of green sputum. When that occurs, she adds Mucinex and Catering manager with good results. She has been traveling to [place], [place] regularly to care for her mother who is in declining health. She notes that her symptoms tend to resolve when she passes through [place] heading to [place], and then recur again at the same place on her way home.  The change is dramatic, occuring suddenly with ear popping and "everything drains."  She was previously on Singulair.  She ran out and hasn't noticed any difference in her symptoms. Claritin D and PRN Benadryl have been working best so far. She also uses Bromelain Sinus ease, massage therapy and vibration therapy which are all helpful x 24 hours. Steam and peppermint essential oil are also helpful.  "I'm sensitive to so many things, and it seems to be getting worse."  She also needs a refill of her COC. Usually this is prescribed by GYN, but she needs the Rx written specifically to indicate that she takes the active pills continuously and does not take the placeholder tablets. Asks me to do that for convenience.    Review of Systems  Constitutional: Negative.   HENT: Positive for congestion, ear pain, postnasal drip, rhinorrhea, sinus pressure, sore throat and voice change. Negative for dental problem, drooling, ear discharge, facial swelling, hearing loss, mouth sores, nosebleeds, sneezing, tinnitus and  trouble swallowing.   Respiratory: Positive for cough. Negative for chest tightness, shortness of breath and wheezing.   Cardiovascular: Negative for chest pain, palpitations and leg swelling.  Gastrointestinal: Negative for nausea, vomiting and diarrhea.  Endocrine: Negative.   Genitourinary: Positive for menstrual problem (irritability and pain with menses, bleeding is irregular. ). Negative for dysuria, urgency, frequency and hematuria.  Skin: Negative for rash.  Allergic/Immunologic: Positive for environmental allergies.  Neurological: Positive for headaches. Negative for dizziness and weakness.  Hematological: Negative for adenopathy.       Patient Active Problem List   Diagnosis Date Noted  . HYPERLIPIDEMIA 09/23/2007  . OBESITY, UNSPECIFIED 09/23/2007  . ENDOMETRIOSIS, SITE UNSPECIFIED 09/23/2007  . OVARIAN CYST 09/23/2007     Prior to Admission medications   Medication Sig Start Date End Date Taking? Authorizing Provider  albuterol (PROVENTIL HFA;VENTOLIN HFA) 108 (90 BASE) MCG/ACT inhaler Inhale 2 puffs into the lungs every 4 (four) hours as needed for wheezing (cough, shortness of breath or wheezing.). 04/28/12  Yes Soleil Mas, PA-C  fluticasone (FLONASE) 50 MCG/ACT nasal spray Place 2 sprays into the nose daily. 04/28/12  Yes Alison Breeding, PA-C  montelukast (SINGULAIR) 10 MG tablet Take 1 tablet (10 mg total) by mouth at bedtime. 04/28/12  Yes Porfirio Oar, PA-C  norethindrone-ethinyl estradiol 1/35 (ORTHO-NOVUM, NORTREL,CYCLAFEM) tablet Take 1 tablet by mouth daily. Skip withdrawal bleed; take continuously. 04/16/14  Yes Sheliah Fiorillo, PA-C  ipratropium (ATROVENT) 0.03 % nasal spray Place 2 sprays into the nose 4 (four) times daily. 04/28/12 07/18/13  Porfirio Oar, PA-C     Allergies  Allergen Reactions  . Latex   . Prednisone Other (See Comments)    Light headness, dizzy       Objective:  Physical Exam  Constitutional: She is oriented to person,  place, and time. Vital signs are normal. She appears well-developed and well-nourished. She is active and cooperative. No distress.  BP 116/90 mmHg  Pulse 100  Temp(Src) 99 F (37.2 C) (Oral)  Resp 16  Ht 5' 3.75" (1.619 m)  Wt 248 lb (112.492 kg)  BMI 42.92 kg/m2  SpO2 97%  LMP 01/23/2015  HENT:  Head: Normocephalic and atraumatic.  Right Ear: Hearing, tympanic membrane, external ear and ear canal normal.  Left Ear: Hearing, tympanic membrane, external ear and ear canal normal.  Nose: Mucosal edema present. No rhinorrhea. Right sinus exhibits no maxillary sinus tenderness and no frontal sinus tenderness. Left sinus exhibits no maxillary sinus tenderness and no frontal sinus tenderness.  Mouth/Throat: Uvula is midline, oropharynx is clear and moist and mucous membranes are normal. Normal dentition. No uvula swelling.  Eyes: Conjunctivae are normal. No scleral icterus.  Neck: Normal range of motion. Neck supple. No thyromegaly present.  Cardiovascular: Normal rate, regular rhythm and normal heart sounds.   Pulses:      Radial pulses are 2+ on the right side, and 2+ on the left side.  Pulmonary/Chest: Effort normal and breath sounds normal.  Lymphadenopathy:       Head (right side): No tonsillar, no preauricular, no posterior auricular and no occipital adenopathy present.       Head (left side): No tonsillar, no preauricular, no posterior auricular and no occipital adenopathy present.    She has no cervical adenopathy.       Right: No supraclavicular adenopathy present.       Left: No supraclavicular adenopathy present.  Neurological: She is alert and oriented to person, place, and time. No sensory deficit.  Skin: Skin is warm, dry and intact. No rash noted. No cyanosis or erythema. Nails show no clubbing.  Psychiatric: She has a normal mood and affect. Her speech is normal and behavior is normal.           Assessment & Plan:   1. Environmental and seasonal allergies Concern  for subacute sinusitis. Treat with antibiotic. Continue other treatments. If symptoms persist, will need ENT vs Allergy evaluation. - amoxicillin-clavulanate (AUGMENTIN) 875-125 MG per tablet; Take 1 tablet by mouth 2 (two) times daily.  Dispense: 20 tablet; Refill: 0 - loratadine-pseudoephedrine (CLARITIN-D 24-HOUR) 10-240 MG per 24 hr tablet; Take 1 tablet by mouth daily.  Dispense: 90 tablet; Refill: 4  2. Need for influenza vaccination - Flu Vaccine QUAD 36+ mos IM  3. Screening for HIV (human immunodeficiency virus) - HIV antibody  4. Endometriosis Stable. Continue COC continuous use. - norethindrone-ethinyl estradiol 1/35 (ORTHO-NOVUM, NORTREL,CYCLAFEM) tablet; Take 1 tablet by mouth daily. take continuously, skip non-hormone containing tablets.  Dispense: 4 Package; Refill: 4   Fernande Bras, PA-C Physician Assistant-Certified [facility]

## 2015-03-13 LAB — HIV ANTIBODY (ROUTINE TESTING W REFLEX): HIV 1&2 Ab, 4th Generation: NONREACTIVE

## 2015-05-27 ENCOUNTER — Ambulatory Visit (INDEPENDENT_AMBULATORY_CARE_PROVIDER_SITE_OTHER): Payer: 59 | Admitting: Physician Assistant

## 2015-05-27 ENCOUNTER — Encounter: Payer: Self-pay | Admitting: Physician Assistant

## 2015-05-27 VITALS — BP 126/86 | HR 92 | Temp 98.6°F | Resp 16 | Ht 64.0 in | Wt 245.0 lb

## 2015-05-27 DIAGNOSIS — R05 Cough: Secondary | ICD-10-CM | POA: Diagnosis not present

## 2015-05-27 DIAGNOSIS — R059 Cough, unspecified: Secondary | ICD-10-CM

## 2015-05-27 DIAGNOSIS — Z91048 Other nonmedicinal substance allergy status: Secondary | ICD-10-CM | POA: Diagnosis not present

## 2015-05-27 DIAGNOSIS — J069 Acute upper respiratory infection, unspecified: Secondary | ICD-10-CM

## 2015-05-27 DIAGNOSIS — Z9109 Other allergy status, other than to drugs and biological substances: Secondary | ICD-10-CM

## 2015-05-27 MED ORDER — HYDROCOD POLST-CPM POLST ER 10-8 MG/5ML PO SUER: 5.0000 mL | Freq: Two times a day (BID) | ORAL | Status: DC | PRN

## 2015-05-27 NOTE — Patient Instructions (Signed)
Take cough syrup at night. Use albuterol as needed for coughing fits/chest tightness. Stop mucinex and delsym. Start back on claritin-D daily. Start back on flonase daily. You will get a phone call about appt with allergy. Let me know if your symptoms have not improved in 1 week.

## 2015-05-27 NOTE — Progress Notes (Signed)
[facility] [address], [place] [address] [phone]  Date:  05/27/2015   Name:  Nancy Franklin   DOB:  1971-04-11   MRN:  [id number]  PCP:  JEFFERY,CHELLE, PA-C    Chief Complaint: Allergies; Cough; and runny nose   History of Present Illness:  This is a 44 y.o. female with PMH asthma and allergic rhinitis who is presenting with cough and rhinitis x 2 weeks. States at onset she was on a plane flying to [facility] for a drama conference. She started having a scratchy throat on the plane. The person sitting next to her was wearing strong cologne which she thought was aggravating her. Over the next few days she started to develop rhinitis and cough. When she returned home she started having fevers and cough became productive. Over the last week her cough has been improving. It is now dry. She is no longer having fever and generally feels ok. Cough is lingering though and keeping her up at night. She denies sob/wheezing. Sore throat resolved. Still having nasal congestion. Denies otalgia. She has been taking delsym and mucinex. She usually takes claritin-D QD but hasn't been taking since she was taking delsym and mucinex. She has flonase but doesn't take unless her allergies are acting up. She has not been taking recently. She has albuterol at home for asthma but states she forgot about it. She does not smoke.   She states she is getting more and more sensitive to certain smells, esp cigarette smoke. If she smells cigarette smoke she will get nauseated, break out in hives and sometimes vomit. She saw an allergist 3 years who told her to "get rid of her convertible and cats" but she states she would never do either of these things. She has 12 cats who sleep with her each night. She is wanting to go back to an allergist.  Review of Systems:  Review of Systems See HPI  Patient Active Problem List   Diagnosis Date Noted  . Environmental and seasonal allergies 03/12/2015  .  HYPERLIPIDEMIA 09/23/2007  . OBESITY, UNSPECIFIED 09/23/2007  . ENDOMETRIOSIS, SITE UNSPECIFIED 09/23/2007  . OVARIAN CYST 09/23/2007    Prior to Admission medications   Medication Sig Start Date End Date Taking? Authorizing Provider  albuterol (PROVENTIL HFA;VENTOLIN HFA) 108 (90 BASE) MCG/ACT inhaler Inhale 2 puffs into the lungs every 4 (four) hours as needed for wheezing (cough, shortness of breath or wheezing.). 04/28/12  Yes [name], PA-C  fluticasone (FLONASE) 50 MCG/ACT nasal spray Place 2 sprays into the nose daily. 04/28/12  Yes [name], PA-C  loratadine-pseudoephedrine (CLARITIN-D 24-HOUR) 10-240 MG per 24 hr tablet Take 1 tablet by mouth daily. 03/12/15  Yes Porfirio Oar, PA-C  norethindrone-ethinyl estradiol 1/35 (ORTHO-NOVUM, NORTREL,CYCLAFEM) tablet Take 1 tablet by mouth daily. take continuously, skip non-hormone containing tablets. 03/12/15  Yes [name], PA-C  VIT C-QUERCET-BIOFLV-BROMELAIN PO Take by mouth daily.   Yes Historical Provider, MD    Allergies  Allergen Reactions  . Latex   . Prednisone Other (See Comments)    Light headness, dizzy  . Pine Rash and Other (See Comments)    Respiratory and eye symptoms    Past Surgical History  Procedure Laterality Date  . Laparoscopic endometriosis fulguration  1997    Social History  Substance Use Topics  . Smoking status: Never Smoker   . Smokeless tobacco: Never Used  . Alcohol Use: No    Family History  Problem Relation Age  of Onset  . Diabetes Mother   . Arthritis Mother     Rheumatoid and Osteoarthritis  . Heart disease Paternal Grandfather     Medication list has been reviewed and updated.  Physical Examination:  Physical Exam  Constitutional: She is oriented to person, place, and time. She appears well-developed and well-nourished. No distress.  HENT:  Head: Normocephalic and atraumatic.  Right Ear: Hearing, tympanic membrane, external ear and ear canal normal.  Left Ear:  Hearing, tympanic membrane, external ear and ear canal normal.  Nose: Nose normal.  Mouth/Throat: Uvula is midline and mucous membranes are normal. Posterior oropharyngeal erythema present. No oropharyngeal exudate or posterior oropharyngeal edema.  Eyes: Conjunctivae and lids are normal. Right eye exhibits no discharge. Left eye exhibits no discharge. No scleral icterus.  Cardiovascular: Normal rate, regular rhythm, normal heart sounds and normal pulses.   No murmur heard. Pulmonary/Chest: Effort normal and breath sounds normal. No respiratory distress. She has no wheezes. She has no rhonchi. She has no rales.  Musculoskeletal: Normal range of motion.  Lymphadenopathy:       Head (right side): No submental, no submandibular and no tonsillar adenopathy present.       Head (left side): No submental, no submandibular and no tonsillar adenopathy present.    She has no cervical adenopathy.  Neurological: She is alert and oriented to person, place, and time.  Skin: Skin is warm, dry and intact. No lesion and no rash noted.  Psychiatric: She has a normal mood and affect. Her speech is normal and behavior is normal. Thought content normal.   BP 126/86 mmHg  Pulse 92  Temp(Src) 98.6 F (37 C) (Oral)  Resp 16  Ht 5\' 4"  (1.626 m)  Wt 245 lb (111.131 kg)  BMI 42.03 kg/m2  SpO2 98%  LMP 04/22/2015  Assessment and Plan:  1. Environmental allergies Advised she start back on claritin-D and flonase daily. She has tried singulair in the past without any help. Referred back to allergist for further testing/treatment. - Ambulatory referral to Allergy  2. Viral URI 3. Cough Cough seems to be improving. She will stop mucinex and delsym. Counseled on rest and hydration. She will use albuterol as needed and tussionex at night. Qvar would have been a good option however she did have dizziness and visual hallucinations with oral pred 3 years ago so will hold off on that for now. She will return in 1 week  if sx not improved. - chlorpheniramine-HYDROcodone (TUSSIONEX PENNKINETIC ER) 10-8 MG/5ML SUER; Take 5 mLs by mouth every 12 (twelve) hours as needed for cough.  Dispense: 100 mL; Refill: 0   Roswell MinersNicole V. Dyke BrackettBush, PA-C, MHS [facility]  05/27/2015

## 2015-06-18 ENCOUNTER — Ambulatory Visit: Payer: 59 | Admitting: Allergy and Immunology

## 2015-06-25 ENCOUNTER — Ambulatory Visit: Payer: 59 | Admitting: Allergy and Immunology

## 2015-08-04 ENCOUNTER — Ambulatory Visit (INDEPENDENT_AMBULATORY_CARE_PROVIDER_SITE_OTHER): Payer: BLUE CROSS/BLUE SHIELD | Admitting: Family Medicine

## 2015-08-04 VITALS — BP 132/88 | HR 109 | Temp 98.0°F | Resp 18 | Ht 64.5 in | Wt 242.0 lb

## 2015-08-04 DIAGNOSIS — R05 Cough: Secondary | ICD-10-CM

## 2015-08-04 DIAGNOSIS — R059 Cough, unspecified: Secondary | ICD-10-CM

## 2015-08-04 DIAGNOSIS — J019 Acute sinusitis, unspecified: Secondary | ICD-10-CM | POA: Diagnosis not present

## 2015-08-04 MED ORDER — SULFAMETHOXAZOLE-TRIMETHOPRIM 800-160 MG PO TABS
1.0000 | ORAL_TABLET | Freq: Two times a day (BID) | ORAL | Status: DC
Start: 2015-08-04 — End: 2015-08-07

## 2015-08-04 MED ORDER — BENZONATATE 200 MG PO CAPS
100.0000 mg | ORAL_CAPSULE | Freq: Three times a day (TID) | ORAL | Status: DC | PRN
Start: 2015-08-04 — End: 2016-03-10

## 2015-08-04 MED ORDER — HYDROCOD POLST-CPM POLST ER 10-8 MG/5ML PO SUER
5.0000 mL | Freq: Two times a day (BID) | ORAL | Status: DC | PRN
Start: 2015-08-04 — End: 2015-08-12

## 2015-08-04 NOTE — Patient Instructions (Signed)

## 2015-08-04 NOTE — Progress Notes (Signed)
Subjective:  By signing my name below, I, Raven Small, attest that this documentation has been prepared under the direction and in the presence of Norberto SorensonEva Cardell Rachel, MD.  Electronically Signed: Andrew Auaven Small, ED Scribe. 08/04/2015. 8:49 AM.   Patient ID: Nancy Franklin, female    DOB: 04/25/1971, 45 y.o.   MRN: [id number]  HPI   Chief Complaint  Patient presents with  . Cough  . Facial Pain   HPI Comments: Nancy Huxleyngela R Debruler is a 45 y.o. female who [facility] complaining of a harsh, productive cough with clear mucous that began 1 week ago. Pt reports associated vomiting due to cough, subjective fever, chills, diaphoresis, improved wheezing and sinus symptoms including sinus pressure and pressure to forehead, around temples, and neck bilaterally. She has tried mucinex, OTC cough syrup, cough drops, honey and salt water. Last abx use was 3 months ago. She reports intolerance to prednisone.   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   Past Medical History  Diagnosis Date  . Allergy   . Endometriosis   . Ovarian cyst    Past Surgical History  Procedure Laterality Date  . Laparoscopic endometriosis fulguration  1997   Prior to Admission medications   Medication Sig Start Date End Date Taking? Authorizing Provider  albuterol (PROVENTIL HFA;VENTOLIN HFA) 108 (90 BASE) MCG/ACT inhaler Inhale 2 puffs into the lungs every 4 (four) hours as needed for wheezing (cough, shortness of breath or wheezing.). 04/28/12  Yes [name], PA-C  fluticasone (FLONASE) 50 MCG/ACT nasal spray Place 2 sprays into the nose daily. 04/28/12  Yes [name], PA-C  loratadine-pseudoephedrine (CLARITIN-D 24-HOUR) 10-240 MG per 24 hr tablet Take 1 tablet by mouth daily. 03/12/15  Yes Porfirio Oarhelle Jeffery, PA-C  norethindrone-ethinyl estradiol 1/35 (ORTHO-NOVUM, NORTREL,CYCLAFEM) tablet Take 1 tablet by mouth daily. take continuously, skip non-hormone containing tablets. 03/12/15  Yes Chelle  Jeffery, PA-C  chlorpheniramine-HYDROcodone (TUSSIONEX PENNKINETIC ER) 10-8 MG/5ML SUER Take 5 mLs by mouth every 12 (twelve) hours as needed for cough. Patient not taking: Reported on 08/04/2015 05/27/15   Lanier ClamNicole Bush V, PA-C  VIT C-QUERCET-BIOFLV-BROMELAIN PO Take by mouth daily. Reported on 08/04/2015    Historical Provider, MD   Review of Systems  Constitutional: Positive for fever, chills and diaphoresis.  HENT: Positive for sinus pressure and voice change. Negative for rhinorrhea.   Respiratory: Positive for cough and wheezing.   Gastrointestinal: Positive for nausea and vomiting.       Objective:   Physical Exam  Constitutional: She is oriented to person, place, and time. She appears well-developed and well-nourished. No distress.  HENT:  Head: Normocephalic and atraumatic.  Right Ear: Hearing, tympanic membrane, external ear and ear canal normal.  Left Ear: Hearing, tympanic membrane, external ear and ear canal normal.  Nose: Nose normal.  Mouth/Throat: Uvula is midline, oropharynx is clear and moist and mucous membranes are normal.  Eyes: Conjunctivae and EOM are normal.  Neck: Neck supple.  Cardiovascular: Normal rate.   Pulmonary/Chest: Effort normal.  Musculoskeletal: Normal range of motion.  Neurological: She is alert and oriented to person, place, and time.  Skin: Skin is warm and dry.  Psychiatric: She has a normal mood and affect. Her behavior is normal.  Nursing note and vitals reviewed.  Filed Vitals:   08/04/15 0837  BP: 132/88  Pulse: 109  Temp: 98 F (36.7 C)  TempSrc: Oral  Resp: 18  Height: 5' 4.5" (1.638 m)  Weight: 242 lb (109.77  kg)  SpO2: 98%   Assessment & Plan:    1. Acute sinusitis, recurrence not specified, unspecified location   2. Cough     Meds ordered this encounter  Medications  . sulfamethoxazole-trimethoprim (BACTRIM DS,SEPTRA DS) 800-160 MG tablet    Sig: Take 1 tablet by mouth 2 (two) times daily.    Dispense:  20 tablet     Refill:  0  . chlorpheniramine-HYDROcodone (TUSSIONEX PENNKINETIC ER) 10-8 MG/5ML SUER    Sig: Take 5 mLs by mouth every 12 (twelve) hours as needed for cough.    Dispense:  180 mL    Refill:  0  . benzonatate (TESSALON) 200 MG capsule    Sig: Take 1 capsule (200 mg total) by mouth 3 (three) times daily as needed for cough.    Dispense:  40 capsule    Refill:  0    I personally performed the services described in this documentation, which was scribed in my presence. The recorded information has been reviewed and considered, and addended by me as needed.  Norberto Sorenson, MD MPH

## 2015-08-07 ENCOUNTER — Ambulatory Visit (INDEPENDENT_AMBULATORY_CARE_PROVIDER_SITE_OTHER): Payer: BLUE CROSS/BLUE SHIELD

## 2015-08-07 ENCOUNTER — Ambulatory Visit (INDEPENDENT_AMBULATORY_CARE_PROVIDER_SITE_OTHER): Payer: BLUE CROSS/BLUE SHIELD | Admitting: Family Medicine

## 2015-08-07 VITALS — BP 126/74 | HR 121 | Temp 98.7°F | Resp 18 | Ht 64.5 in | Wt 247.0 lb

## 2015-08-07 DIAGNOSIS — R059 Cough, unspecified: Secondary | ICD-10-CM

## 2015-08-07 DIAGNOSIS — J189 Pneumonia, unspecified organism: Secondary | ICD-10-CM | POA: Diagnosis not present

## 2015-08-07 DIAGNOSIS — R05 Cough: Secondary | ICD-10-CM

## 2015-08-07 LAB — POCT CBC
Granulocyte percent: 85.2 %G — AB (ref 37–80)
HEMATOCRIT: 42.2 % (ref 37.7–47.9)
Hemoglobin: 14.9 g/dL (ref 12.2–16.2)
LYMPH, POC: 1.2 (ref 0.6–3.4)
MCH, POC: 29.2 pg (ref 27–31.2)
MCHC: 35.3 g/dL (ref 31.8–35.4)
MCV: 82.6 fL (ref 80–97)
MID (cbc): 0.8 (ref 0–0.9)
MPV: 6.8 fL (ref 0–99.8)
POC Granulocyte: 11.4 — AB (ref 2–6.9)
POC LYMPH %: 9.2 % — AB (ref 10–50)
POC MID %: 5.6 %M (ref 0–12)
Platelet Count, POC: 500 10*3/uL — AB (ref 142–424)
RBC: 5.1 M/uL (ref 4.04–5.48)
RDW, POC: 13.6 %
WBC: 13.4 10*3/uL — AB (ref 4.6–10.2)

## 2015-08-07 LAB — POCT SEDIMENTATION RATE: POCT SED RATE: 89 mm/h — AB (ref 0–22)

## 2015-08-07 MED ORDER — ALBUTEROL SULFATE (2.5 MG/3ML) 0.083% IN NEBU
2.5000 mg | INHALATION_SOLUTION | Freq: Once | RESPIRATORY_TRACT | Status: AC
  Administered 2015-08-07: 2.5 mg via RESPIRATORY_TRACT

## 2015-08-07 MED ORDER — OXYCODONE-ACETAMINOPHEN 5-325 MG PO TABS: 1.0000 | ORAL_TABLET | Freq: Three times a day (TID) | ORAL | Status: DC | PRN

## 2015-08-07 MED ORDER — PROMETHAZINE-CODEINE 6.25-10 MG/5ML PO SYRP: 10.0000 mL | ORAL_SOLUTION | Freq: Four times a day (QID) | ORAL | Status: DC | PRN

## 2015-08-07 MED ORDER — LEVOFLOXACIN 750 MG PO TABS: 750.0000 mg | ORAL_TABLET | Freq: Every day | ORAL | Status: DC

## 2015-08-07 MED ORDER — CEFTRIAXONE SODIUM 1 G IJ SOLR
1.0000 g | INTRAMUSCULAR | Status: AC
  Administered 2015-08-07 – 2015-08-09 (×3): 1 g via INTRAMUSCULAR

## 2015-08-07 MED ORDER — ALBUTEROL SULFATE (2.5 MG/3ML) 0.083% IN NEBU: 2.5000 mg | INHALATION_SOLUTION | Freq: Four times a day (QID) | RESPIRATORY_TRACT | Status: DC | PRN

## 2015-08-07 NOTE — Progress Notes (Signed)
Subjective:  By signing my name below, I, Stann Ore, attest that this documentation has been prepared under the direction and in the presence of Norberto Sorenson, MD. Electronically Signed: Stann Ore, Scribe. 08/07/2015 , 8:34 AM .  Patient was seen in Room 11 .   Patient ID: Nancy Franklin, female    DOB: August 19, 1970, 45 y.o.   MRN: [id number] Chief Complaint  Patient presents with  . Follow-up    no better worse   . Nausea  . Emesis    last night  . Wheezing  . Cough    yellow mucous    HPI Nancy Franklin is a 45 y.o. female who presents to [facility] for follow up for her cough. Pt was seen 3 days ago and put on bactrim and cough suppressants for sinusitis.   She states she was up all last night due to phlegm and wheezing with deep coughs. She's just concerned because this was day 5 of this illness. She notes that her fever has been waxing and waning, and sweats. Her sinus pressure has improved. She's still using albuterol and flonase. She's using her inhaler 4-5 times a day without relief. She denies history of smoking. She denies history of asthma. She denies complications with the bactrim.   Past Medical History  Diagnosis Date  . Allergy   . Endometriosis   . Ovarian cyst    Prior to Admission medications   Medication Sig Start Date End Date Taking? Authorizing Provider  albuterol (PROVENTIL HFA;VENTOLIN HFA) 108 (90 BASE) MCG/ACT inhaler Inhale 2 puffs into the lungs every 4 (four) hours as needed for wheezing (cough, shortness of breath or wheezing.). 04/28/12  Yes [name], PA-C  benzonatate (TESSALON) 200 MG capsule Take 1 capsule (200 mg total) by mouth 3 (three) times daily as needed for cough. 08/04/15  Yes Sherren Mocha, MD  chlorpheniramine-HYDROcodone [facility] PENNKINETIC ER) 10-8 MG/5ML SUER Take 5 mLs by mouth every 12 (twelve) hours as needed for cough. 08/04/15  Yes Sherren Mocha, MD  fluticasone (FLONASE) 50 MCG/ACT nasal spray Place 2 sprays into the nose daily.  04/28/12  Yes [name], PA-C  loratadine-pseudoephedrine (CLARITIN-D 24-HOUR) 10-240 MG per 24 hr tablet Take 1 tablet by mouth daily. 03/12/15  Yes Porfirio Oar, PA-C  norethindrone-ethinyl estradiol 1/35 (ORTHO-NOVUM, NORTREL,CYCLAFEM) tablet Take 1 tablet by mouth daily. take continuously, skip non-hormone containing tablets. 03/12/15  Yes [name], PA-C  sulfamethoxazole-trimethoprim (BACTRIM DS,SEPTRA DS) 800-160 MG tablet Take 1 tablet by mouth 2 (two) times daily. 08/04/15  Yes Sherren Mocha, MD  VIT C-QUERCET-BIOFLV-BROMELAIN PO Take by mouth daily. Reported on 08/04/2015   Yes Historical Provider, MD   Allergies  Allergen Reactions  . Latex   . Prednisone Other (See Comments)    Light headness, dizzy  . Pine Rash and Other (See Comments)    Respiratory and eye symptoms    Review of Systems  Constitutional: Positive for fever, chills, diaphoresis and fatigue.  HENT: Positive for congestion and sore throat. Negative for rhinorrhea and sinus pressure.   Respiratory: Positive for cough, shortness of breath and wheezing.   Cardiovascular: Negative for chest pain.  Gastrointestinal: Positive for nausea and vomiting. Negative for abdominal pain.       Objective:   Physical Exam  Constitutional: She is oriented to person, place, and time. She appears well-developed and well-nourished. No distress.  HENT:  Head: Normocephalic and atraumatic.  Right Ear: Tympanic membrane normal.  Left Ear: Tympanic membrane  normal.  Nose: Nose normal.  Mouth/Throat: Posterior oropharyngeal erythema present.  Eyes: EOM are normal. Pupils are equal, round, and reactive to light.  Neck: Neck supple.  Cardiovascular: Normal rate.   Pulmonary/Chest: Effort normal. No respiratory distress. She has decreased breath sounds.  Musculoskeletal: Normal range of motion.  Lymphadenopathy:       Head (right side): Submandibular and tonsillar adenopathy present.       Head (left side): Submandibular  and tonsillar adenopathy present.  Neurological: She is alert and oriented to person, place, and time.  Skin: Skin is warm and dry.  Psychiatric: She has a normal mood and affect. Her behavior is normal.  Nursing note and vitals reviewed.   BP 126/74 mmHg  Pulse 121  Temp(Src) 98.7 F (37.1 C) (Oral)  Resp 18  Ht 5' 4.5" (1.638 m)  Wt 247 lb (112.038 kg)  BMI 41.76 kg/m2  SpO2 95%  LMP 06/11/2015  [facility] reading (PRIMARY) by Dr. Clelia Croft : chest xray: bilateral pneumonia right middle lobe and left base, left anterior effusion     Results for orders placed or performed in visit on 08/07/15  POCT CBC  Result Value Ref Range   WBC 13.4 (A) 4.6 - 10.2 K/uL   Lymph, poc 1.2 0.6 - 3.4   POC LYMPH PERCENT 9.2 (A) 10 - 50 %L   MID (cbc) 0.8 0 - 0.9   POC MID % 5.6 0 - 12 %M   POC Granulocyte 11.4 (A) 2 - 6.9   Granulocyte percent 85.2 (A) 37 - 80 %G   RBC 5.10 4.04 - 5.48 M/uL   Hemoglobin 14.9 12.2 - 16.2 g/dL   HCT, POC 40.9 81.1 - 47.9 %   MCV 82.6 80 - 97 fL   MCH, POC 29.2 27 - 31.2 pg   MCHC 35.3 31.8 - 35.4 g/dL   RDW, POC 91.4 %   Platelet Count, POC 500 (A) 142 - 424 K/uL   MPV 6.8 0 - 99.8 fL    Assessment & Plan:   1. Cough   2. Bilateral pneumonia   Stop bactrim.  Start Rocephin 1g IM x 1 today and continue every 24 hrs for 3 doses.  Start levaquin 750 qd x 5d.  Recheck in 48 hrs - sooner if worse.  Pt coughing and unable to sleep through Tussionex even - only working for 2 hrs so can try promethazine-codeine for breakthrough cough as promethazine will hopefully also help with her nausea and if needed at night can try oxycodone for cough and myalgias/arthralgias - did not want to mix classes of sedative meds for sleep esp since pt is naieve to bzd or sleep meds. Needs home neb machine for the next 1-2 weeks during this acute illness - has not gotten any significant benefit from albuterol inhaler but responded very well to alb neb in office so cont q4 hrs sched until  f/u. If worsens at all -> to ER.  Orders Placed This Encounter  Procedures  . DME Nebulizer machine  . DG Chest 2 View    Standing Status: Future     Number of Occurrences: 1     Standing Expiration Date: 08/06/2016    Order Specific Question:  Reason for Exam (SYMPTOM  OR DIAGNOSIS REQUIRED)    Answer:  worsening on bactrim, suspect pneumonia    Order Specific Question:  Is the patient pregnant?    Answer:  No    Order Specific Question:  Preferred imaging location?  Answer:  External  . POCT CBC  . POCT SEDIMENTATION RATE    Meds ordered this encounter  Medications  . albuterol (PROVENTIL) (2.5 MG/3ML) 0.083% nebulizer solution 2.5 mg    Sig:   . cefTRIAXone (ROCEPHIN) injection 1 g    Sig:     Order Specific Question:  Antibiotic Indication:    Answer:  CAP  . levofloxacin (LEVAQUIN) 750 MG tablet    Sig: Take 1 tablet (750 mg total) by mouth daily.    Dispense:  5 tablet    Refill:  0  . albuterol (PROVENTIL) (2.5 MG/3ML) 0.083% nebulizer solution    Sig: Take 3 mLs (2.5 mg total) by nebulization every 6 (six) hours as needed for wheezing or shortness of breath.    Dispense:  150 mL    Refill:  1  . promethazine-codeine (PHENERGAN WITH CODEINE) 6.25-10 MG/5ML syrup    Sig: Take 10 mLs by mouth every 6 (six) hours as needed for cough.    Dispense:  240 mL    Refill:  0  . oxyCODONE-acetaminophen (ROXICET) 5-325 MG tablet    Sig: Take 1 tablet by mouth every 8 (eight) hours as needed (for severe cough).    Dispense:  20 tablet    Refill:  0    I personally performed the services described in this documentation, which was scribed in my presence. The recorded information has been reviewed and considered, and addended by me as needed.  Norberto Sorenson, MD MPH

## 2015-08-07 NOTE — Patient Instructions (Signed)

## 2015-08-08 ENCOUNTER — Ambulatory Visit: Payer: BLUE CROSS/BLUE SHIELD

## 2015-08-08 VITALS — HR 99 | Temp 98.6°F

## 2015-08-08 DIAGNOSIS — R05 Cough: Secondary | ICD-10-CM | POA: Diagnosis not present

## 2015-08-08 DIAGNOSIS — R059 Cough, unspecified: Secondary | ICD-10-CM

## 2015-08-08 DIAGNOSIS — J189 Pneumonia, unspecified organism: Secondary | ICD-10-CM

## 2015-08-09 ENCOUNTER — Ambulatory Visit (INDEPENDENT_AMBULATORY_CARE_PROVIDER_SITE_OTHER): Payer: BLUE CROSS/BLUE SHIELD | Admitting: Family Medicine

## 2015-08-09 VITALS — BP 126/74 | HR 105 | Temp 97.8°F | Resp 18 | Ht 64.5 in | Wt 244.0 lb

## 2015-08-09 DIAGNOSIS — J189 Pneumonia, unspecified organism: Secondary | ICD-10-CM

## 2015-08-09 LAB — POCT CBC
Granulocyte percent: 70.2 %G (ref 37–80)
HEMATOCRIT: 42.5 % (ref 37.7–47.9)
HEMOGLOBIN: 14.7 g/dL (ref 12.2–16.2)
Lymph, poc: 1.3 (ref 0.6–3.4)
MCH: 28.6 pg (ref 27–31.2)
MCHC: 34.7 g/dL (ref 31.8–35.4)
MCV: 82.4 fL (ref 80–97)
MID (cbc): 0.4 (ref 0–0.9)
MPV: 5.9 fL (ref 0–99.8)
POC GRANULOCYTE: 4 (ref 2–6.9)
POC LYMPH PERCENT: 22.7 %L (ref 10–50)
POC MID %: 7.1 %M (ref 0–12)
Platelet Count, POC: 523 10*3/uL — AB (ref 142–424)
RBC: 5.16 M/uL (ref 4.04–5.48)
RDW, POC: 13.1 %
WBC: 5.7 10*3/uL (ref 4.6–10.2)

## 2015-08-09 LAB — POCT SEDIMENTATION RATE: POCT SED RATE: 97 mm/h — AB (ref 0–22)

## 2015-08-09 MED ORDER — IPRATROPIUM BROMIDE 0.02 % IN SOLN
0.5000 mg | Freq: Once | RESPIRATORY_TRACT | Status: AC
  Administered 2015-08-09: 0.5 mg via RESPIRATORY_TRACT

## 2015-08-09 MED ORDER — ALBUTEROL SULFATE (2.5 MG/3ML) 0.083% IN NEBU
2.5000 mg | INHALATION_SOLUTION | Freq: Once | RESPIRATORY_TRACT | Status: AC
  Administered 2015-08-09: 2.5 mg via RESPIRATORY_TRACT

## 2015-08-09 NOTE — Patient Instructions (Signed)

## 2015-08-09 NOTE — Progress Notes (Addendum)
Subjective:  By signing my name below, I, Rawaa Al Rifaie, attest that this documentation has been prepared under the direction and in the presence of Norberto Sorenson, MD.  Broadus John, Medical Scribe. 08/09/2015.  9:43 AM.    Patient ID: Nancy Franklin, female    DOB: 1971-06-27, 45 y.o.   MRN: [id number]  Chief Complaint  Patient presents with  . Follow-up    pneumonia    HPI HPI Comments: Nancy Franklin is a 45 y.o. female who presents to [facility] for a follow up regarding PNA. Pt had severe BL PNA. She was started on Levaquin 750 mg qd for 5 days, and rocephin 1 g IM for 3 days. She is here for her third injection today.  Today, pt reports that she is feeling better overall. She notes that her tinnitus resolved yesterday, however she indicates that yesterday she was presented with severe chest wall and back pain. She states that she is very fatigued, and she still presents with cough. She reports finding great relief with the breathing treatments. Pt reports having a very good night sleep last night for the first time since onset of symptoms.    Patient Active Problem List   Diagnosis Date Noted  . Environmental and seasonal allergies 03/12/2015  . HYPERLIPIDEMIA 09/23/2007  . OBESITY, UNSPECIFIED 09/23/2007  . ENDOMETRIOSIS, SITE UNSPECIFIED 09/23/2007  . OVARIAN CYST 09/23/2007   Past Medical History  Diagnosis Date  . Allergy   . Endometriosis   . Ovarian cyst    Past Surgical History  Procedure Laterality Date  . Laparoscopic endometriosis fulguration  1997   Allergies  Allergen Reactions  . Latex   . Prednisone Other (See Comments)    Light headness, dizzy  . Pine Rash and Other (See Comments)    Respiratory and eye symptoms   Prior to Admission medications   Medication Sig Start Date End Date Taking? Authorizing Provider  albuterol (PROVENTIL HFA;VENTOLIN HFA) 108 (90 BASE) MCG/ACT inhaler Inhale 2 puffs into the lungs every 4 (four)  hours as needed for wheezing (cough, shortness of breath or wheezing.). 04/28/12  Yes [name], PA-C  albuterol (PROVENTIL) (2.5 MG/3ML) 0.083% nebulizer solution Take 3 mLs (2.5 mg total) by nebulization every 6 (six) hours as needed for wheezing or shortness of breath. 08/07/15  Yes Sherren Mocha, MD  benzonatate (TESSALON) 200 MG capsule Take 1 capsule (200 mg total) by mouth 3 (three) times daily as needed for cough. 08/04/15  Yes Sherren Mocha, MD  chlorpheniramine-HYDROcodone [facility] PENNKINETIC ER) 10-8 MG/5ML SUER Take 5 mLs by mouth every 12 (twelve) hours as needed for cough. 08/04/15  Yes Sherren Mocha, MD  fluticasone (FLONASE) 50 MCG/ACT nasal spray Place 2 sprays into the nose daily. 04/28/12  Yes [name], PA-C  levofloxacin (LEVAQUIN) 750 MG tablet Take 1 tablet (750 mg total) by mouth daily. 08/07/15  Yes Sherren Mocha, MD  loratadine-pseudoephedrine (CLARITIN-D 24-HOUR) 10-240 MG per 24 hr tablet Take 1 tablet by mouth daily. 03/12/15  Yes Porfirio Oar, PA-C  norethindrone-ethinyl estradiol 1/35 (ORTHO-NOVUM, NORTREL,CYCLAFEM) tablet Take 1 tablet by mouth daily. take continuously, skip non-hormone containing tablets. 03/12/15  Yes [name], PA-C  oxyCODONE-acetaminophen (ROXICET) 5-325 MG tablet Take 1 tablet by mouth every 8 (eight) hours as needed (for severe cough). 08/07/15  Yes Sherren Mocha, MD  promethazine-codeine (PHENERGAN WITH CODEINE) 6.25-10 MG/5ML syrup Take 10 mLs by mouth every 6 (six) hours as needed  for cough. 08/07/15  Yes Sherren Mocha, MD   Social History   Social History  . Marital Status: Single    Spouse Name: n/a  . Number of Children: 0  . Years of Education: 21   Occupational History  . THERAPIST    Social History Main Topics  . Smoking status: Never Smoker   . Smokeless tobacco: Never Used  . Alcohol Use: No  . Drug Use: No  . Sexual Activity:    Partners: Male    Birth Control/ Protection: Pill   Other Topics Concern  . Not on file    Social History Narrative   Continues in [employer].  Looking to expand it and other contractural jobs with insurance, or look into an individual policy, effective January 2014.    Review of Systems  Constitutional: Positive for chills, diaphoresis, activity change, appetite change and fatigue. Negative for unexpected weight change.  HENT: Negative for tinnitus.   Respiratory: Positive for cough, chest tightness, shortness of breath and wheezing. Negative for choking.   Cardiovascular: Positive for chest pain. Negative for palpitations.  Musculoskeletal: Positive for back pain.  Skin: Positive for pallor. Negative for rash.  Neurological: Positive for weakness, light-headedness and headaches. Negative for dizziness.  Hematological: Positive for adenopathy.  Psychiatric/Behavioral: Positive for sleep disturbance.      Objective:   Physical Exam  Constitutional: She is oriented to person, place, and time. She appears well-developed and well-nourished. No distress.  HENT:  Head: Normocephalic and atraumatic.  Eyes: EOM are normal. Pupils are equal, round, and reactive to light.  Neck: Neck supple.  Cardiovascular: Normal rate.   Pulmonary/Chest: Effort normal. She has wheezes ( Inspiratory). She has rhonchi (Expiratory). She has rales ( Inspiratory and expiratory).  Decreased breath sounds thoughout.  Neurological: She is alert and oriented to person, place, and time. No cranial nerve deficit.  Skin: Skin is warm and dry.  Psychiatric: She has a normal mood and affect. Her behavior is normal.  Nursing note and vitals reviewed.   BP 126/74 mmHg  Pulse 105  Temp(Src) 97.8 F (36.6 C) (Oral)  Resp 18  Ht 5' 4.5" (1.638 m)  Wt 244 lb (110.678 kg)  BMI 41.25 kg/m2  SpO2 95%  LMP 06/11/2015     Results for orders placed or performed in visit on 08/09/15  POCT CBC  Result Value Ref Range   WBC 5.7 4.6 - 10.2 K/uL   Lymph, poc 1.3 0.6 - 3.4   POC LYMPH PERCENT 22.7 10 -  50 %L   MID (cbc) 0.4 0 - 0.9   POC MID % 7.1 0 - 12 %M   POC Granulocyte 4.0 2 - 6.9   Granulocyte percent 70.2 37 - 80 %G   RBC 5.16 4.04 - 5.48 M/uL   Hemoglobin 14.7 12.2 - 16.2 g/dL   HCT, POC 16.1 09.6 - 47.9 %   MCV 82.4 80 - 97 fL   MCH, POC 28.6 27 - 31.2 pg   MCHC 34.7 31.8 - 35.4 g/dL   RDW, POC 04.5 %   Platelet Count, POC 523 (A) 142 - 424 K/uL   MPV 5.9 0 - 99.8 fL  POCT SEDIMENTATION RATE  Result Value Ref Range   POCT SED RATE 97 (A) 0 - 22 mm/hr    Assessment & Plan:   1. CAP (community acquired pneumonia)   Pt has not worsened but cleaerly still very ill. Cont levaquin, recheck in 2-3d.  Orders Placed This  Encounter  Procedures  . POCT CBC  . POCT SEDIMENTATION RATE    Meds ordered this encounter  Medications  . albuterol (PROVENTIL) (2.5 MG/3ML) 0.083% nebulizer solution 2.5 mg    Sig:   . ipratropium (ATROVENT) nebulizer solution 0.5 mg    Sig:     I personally performed the services described in this documentation, which was scribed in my presence. The recorded information has been reviewed and considered, and addended by me as needed.  Norberto Sorenson, MD MPH

## 2015-08-12 ENCOUNTER — Ambulatory Visit (INDEPENDENT_AMBULATORY_CARE_PROVIDER_SITE_OTHER): Payer: BLUE CROSS/BLUE SHIELD | Admitting: Family Medicine

## 2015-08-12 VITALS — BP 126/80 | HR 96 | Temp 98.0°F | Resp 17 | Ht 64.5 in | Wt 245.0 lb

## 2015-08-12 DIAGNOSIS — J189 Pneumonia, unspecified organism: Secondary | ICD-10-CM

## 2015-08-12 DIAGNOSIS — R05 Cough: Secondary | ICD-10-CM

## 2015-08-12 DIAGNOSIS — R059 Cough, unspecified: Secondary | ICD-10-CM

## 2015-08-12 MED ORDER — HYDROCOD POLST-CPM POLST ER 10-8 MG/5ML PO SUER: 5.0000 mL | Freq: Two times a day (BID) | ORAL | Status: DC | PRN

## 2015-08-12 NOTE — Progress Notes (Signed)
Subjective:    Patient ID: Nancy Franklin, female    DOB: 22-May-1971, 45 y.o.   MRN: [id number] By signing my name below, I, Judithe Modest, attest that this documentation has been prepared under the direction and in the presence of [name], MD. Electronically Signed: Judithe Modest, ER Scribe. 08/12/2015. 4:01 PM.  Chief Complaint  Patient presents with  . Follow-up    pneumonia    HPI HPI Comments: Nancy Franklin is a 45 y.o. female who presents to [facility] for a follow up due to continued pneumonia sx. Yesterday and the day before she had a continued productive cough with signigicant clear purulent drainage. She also endorses continued improving SOB. Her sx usually become worse around 3pm, but she would usually take mucinex around 2pm. She is having significantly less diaphoresis. She has done three breathing treatments today, the last one at 2pm. She denies feeling her heart race or feeling palpitations. She has been seen three times since her initial diagnosis 01/15, 01/18, and 08/09/2015.   Past Medical History  Diagnosis Date  . Allergy   . Endometriosis   . Ovarian cyst    Allergies  Allergen Reactions  . Latex   . Prednisone Other (See Comments)    Light headness, dizzy  . Pine Rash and Other (See Comments)    Respiratory and eye symptoms   Current Outpatient Prescriptions on File Prior to Visit  Medication Sig Dispense Refill  . albuterol (PROVENTIL HFA;VENTOLIN HFA) 108 (90 BASE) MCG/ACT inhaler Inhale 2 puffs into the lungs every 4 (four) hours as needed for wheezing (cough, shortness of breath or wheezing.). 3 Inhaler 0  . albuterol (PROVENTIL) (2.5 MG/3ML) 0.083% nebulizer solution Take 3 mLs (2.5 mg total) by nebulization every 6 (six) hours as needed for wheezing or shortness of breath. 150 mL 1  . benzonatate (TESSALON) 200 MG capsule Take 1 capsule (200 mg total) by mouth 3 (three) times daily as needed for cough. 40 capsule 0  . chlorpheniramine-HYDROcodone  (TUSSIONEX PENNKINETIC ER) 10-8 MG/5ML SUER Take 5 mLs by mouth every 12 (twelve) hours as needed for cough. 180 mL 0  . fluticasone (FLONASE) 50 MCG/ACT nasal spray Place 2 sprays into the nose daily. 54 g 3  . levofloxacin (LEVAQUIN) 750 MG tablet Take 1 tablet (750 mg total) by mouth daily. 5 tablet 0  . loratadine-pseudoephedrine (CLARITIN-D 24-HOUR) 10-240 MG per 24 hr tablet Take 1 tablet by mouth daily. 90 tablet 4  . norethindrone-ethinyl estradiol 1/35 (ORTHO-NOVUM, NORTREL,CYCLAFEM) tablet Take 1 tablet by mouth daily. take continuously, skip non-hormone containing tablets. 4 Package 4  . oxyCODONE-acetaminophen (ROXICET) 5-325 MG tablet Take 1 tablet by mouth every 8 (eight) hours as needed (for severe cough). 20 tablet 0  . promethazine-codeine (PHENERGAN WITH CODEINE) 6.25-10 MG/5ML syrup Take 10 mLs by mouth every 6 (six) hours as needed for cough. 240 mL 0   No current facility-administered medications on file prior to visit.    Review of Systems  Constitutional: Positive for diaphoresis. Negative for fever and chills.  HENT: Positive for congestion and rhinorrhea.   Respiratory: Positive for cough and shortness of breath.   Cardiovascular: Negative for chest pain and palpitations.       Objective:  BP 126/80 mmHg  Pulse 96  Temp(Src) 98 F (36.7 C) (Oral)  Resp 17  Ht 5' 4.5" (1.638 m)  Wt 245 lb (111.131 kg)  BMI 41.42 kg/m2  SpO2 96%  LMP 06/11/2015  Physical Exam  Constitutional: She is oriented to person, place, and time. She appears well-developed and well-nourished. No distress.  HENT:  Head: Normocephalic and atraumatic.  Eyes: Pupils are equal, round, and reactive to light.  Neck: Neck supple.  Cardiovascular: Regular rhythm and normal heart sounds.   No murmur heard. Tachycardic, but regular rhythm.   Pulmonary/Chest: No respiratory distress. She has rales.  Left lower lobe inspiratory rhonchi. Right lower lobe inspiratory rales. Good air movement.     Musculoskeletal: Normal range of motion.  Neurological: She is alert and oriented to person, place, and time. Coordination normal.  Skin: Skin is warm and dry. She is not diaphoretic.  Psychiatric: She has a normal mood and affect. Her behavior is normal.  Nursing note and vitals reviewed.     Assessment & Plan:   1. CAP (community acquired pneumonia)   2. Cough   Pt definitely improving but has had severe bilateral pneumonia and so is not yet out of the woods. Completed antibiotic levaquin yesterday. Continue albuterol neb q4 hours, rest, fluids. Recheck in 48 hours - will likely want to repeat labs cbc and esr then - may be able to return to work after recheck.  Will definitely need to repeat CXR in 1 mo to ensure clearance.  Meds ordered this encounter  Medications  . chlorpheniramine-HYDROcodone (TUSSIONEX PENNKINETIC ER) 10-8 MG/5ML SUER    Sig: Take 5 mLs by mouth every 12 (twelve) hours as needed for cough.    Dispense:  180 mL    Refill:  0    I personally performed the services described in this documentation, which was scribed in my presence. The recorded information has been reviewed and considered, and addended by me as needed.  [name], MD MPH

## 2015-08-12 NOTE — Patient Instructions (Signed)
Continue with Mucinex.    Community-Acquired Pneumonia, Adult Pneumonia is an infection of the lungs. There are different types of pneumonia. One type can develop while a person is in a hospital. A different type, called community-acquired pneumonia, develops in people who are not, or have not recently been, in the hospital or other health care facility.  CAUSES Pneumonia may be caused by bacteria, viruses, or funguses. Community-acquired pneumonia is often caused by Streptococcus pneumonia bacteria. These bacteria are often passed from one person to another by breathing in droplets from the cough or sneeze of an infected person. RISK FACTORS The condition is more likely to develop in:  People who havechronic diseases, such as chronic obstructive pulmonary disease (COPD), asthma, congestive heart failure, cystic fibrosis, diabetes, or kidney disease.  People who haveearly-stage or late-stage HIV.  People who havesickle cell disease.  People who havehad their spleen removed (splenectomy).  People who havepoor Administrator.  People who havemedical conditions that increase the risk of breathing in (aspirating) secretions their own mouth and nose.   People who havea weakened immune system (immunocompromised).  People who smoke.  People whotravel to areas where pneumonia-causing germs commonly exist.  People whoare around animal habitats or animals that have pneumonia-causing germs, including birds, bats, rabbits, cats, and farm animals. SYMPTOMS Symptoms of this condition include:  Adry cough.  A wet (productive) cough.  Fever.  Sweating.  Chest pain, especially when breathing deeply or coughing.  Rapid breathing or difficulty breathing.  Shortness of breath.  Shaking chills.  Fatigue.  Muscle aches. DIAGNOSIS Your health care provider will take a medical history and perform a physical exam. You may also have other tests, including:  Imaging studies of  your chest, including X-rays.  Tests to check your blood oxygen level and other blood gases.  Other tests on blood, mucus (sputum), fluid around your lungs (pleural fluid), and urine. If your pneumonia is severe, other tests may be done to identify the specific cause of your illness. TREATMENT The type of treatment that you receive depends on many factors, such as the cause of your pneumonia, the medicines you take, and other medical conditions that you have. For most adults, treatment and recovery from pneumonia may occur at home. In some cases, treatment must happen in a hospital. Treatment may include:  Antibiotic medicines, if the pneumonia was caused by bacteria.  Antiviral medicines, if the pneumonia was caused by a virus.  Medicines that are given by mouth or through an IV tube.  Oxygen.  Respiratory therapy. Although rare, treating severe pneumonia may include:  Mechanical ventilation. This is done if you are not breathing well on your own and you cannot maintain a safe blood oxygen level.  Thoracentesis. This procedureremoves fluid around one lung or both lungs to help you breathe better. HOME CARE INSTRUCTIONS  Take over-the-counter and prescription medicines only as told by your health care provider.  Only takecough medicine if you are losing sleep. Understand that cough medicine can prevent your body's natural ability to remove mucus from your lungs.  If you were prescribed an antibiotic medicine, take it as told by your health care provider. Do not stop taking the antibiotic even if you start to feel better.  Sleep in a semi-upright position at night. Try sleeping in a reclining chair, or place a few pillows under your head.  Do not use tobacco products, including cigarettes, chewing tobacco, and e-cigarettes. If you need help quitting, ask your health care provider.  Drink  enough water to keep your urine clear or pale yellow. This will help to thin out mucus  secretions in your lungs. PREVENTION There are ways that you can decrease your risk of developing community-acquired pneumonia. Consider getting a pneumococcal vaccine if:  You are older than 43 years of age.  You are older than 43 years of age and are undergoing cancer treatment, have chronic lung disease, or have other medical conditions that affect your immune system. Ask your health care provider if this applies to you. There are different types and schedules of pneumococcal vaccines. Ask your health care provider which vaccination option is best for you. You may also prevent community-acquired pneumonia if you take these actions:  Get an influenza vaccine every year. Ask your health care provider which type of influenza vaccine is best for you.  Go to the dentist on a regular basis.  Wash your hands often. Use hand sanitizer if soap and water are not available. SEEK MEDICAL CARE IF:  You have a fever.  You are losing sleep because you cannot control your cough with cough medicine. SEEK IMMEDIATE MEDICAL CARE IF:  You have worsening shortness of breath.  You have increased chest pain.  Your sickness becomes worse, especially if you are an older adult or have a weakened immune system.  You cough up blood.   This information is not intended to replace advice given to you by your health care provider. Make sure you discuss any questions you have with your health care provider.   Document Released: 07/06/2005 Document Revised: 03/27/2015 Document Reviewed: 10/31/2014 Elsevier Interactive Patient Education [employer].

## 2015-08-14 ENCOUNTER — Ambulatory Visit (INDEPENDENT_AMBULATORY_CARE_PROVIDER_SITE_OTHER): Payer: BLUE CROSS/BLUE SHIELD | Admitting: Family Medicine

## 2015-08-14 VITALS — BP 128/88 | HR 104 | Temp 98.3°F | Resp 20 | Wt 243.4 lb

## 2015-08-14 DIAGNOSIS — J189 Pneumonia, unspecified organism: Secondary | ICD-10-CM | POA: Diagnosis not present

## 2015-08-14 LAB — POCT CBC
Granulocyte percent: 66.1 %G (ref 37–80)
HCT, POC: 45.2 % (ref 37.7–47.9)
HEMOGLOBIN: 15.5 g/dL (ref 12.2–16.2)
Lymph, poc: 2.6 (ref 0.6–3.4)
MCH: 28.8 pg (ref 27–31.2)
MCHC: 34.4 g/dL (ref 31.8–35.4)
MCV: 83.7 fL (ref 80–97)
MID (cbc): 0.8 (ref 0–0.9)
MPV: 6.5 fL (ref 0–99.8)
PLATELET COUNT, POC: 547 10*3/uL — AB (ref 142–424)
POC Granulocyte: 6.7 (ref 2–6.9)
POC LYMPH PERCENT: 25.6 %L (ref 10–50)
POC MID %: 8.3 % (ref 0–12)
RBC: 5.4 M/uL (ref 4.04–5.48)
RDW, POC: 13.8 %
WBC: 10.1 10*3/uL (ref 4.6–10.2)

## 2015-08-14 LAB — POCT SEDIMENTATION RATE: POCT SED RATE: 50 mm/hr — AB (ref 0–22)

## 2015-08-14 NOTE — Progress Notes (Signed)
Subjective:  This chart was scribed for Nancy Sorenson MD, by Veverly Fells, at [facility].  This patient was seen in room  1  and the patient's care was started at 9:51 AM.   Chief Complaint  Patient presents with  . Follow-up    for pneumonia     Patient ID: Nancy Franklin, female    DOB: 07-14-1971, 45 y.o.   MRN: [id number]  HPI HPI Comments: Nancy Franklin is a 45 y.o. female who presents to the [facility] for a follow up.  Patient has had a severe bilateral pnuemonia.  She was nitially treated for sinusitis 10 days ago with bactram but three days after startgin gthat, her cough and fever dram worsened so she was transt to Levaquin 750 QD when x ray reveled pnuemonia.  We got her an albuterol nebulizer machine which she has been usin gever 4 hours at home and has been keeping very close followup every 2-3 days with slow but stead improvement.  She finished the antibitoic course 2 days ago but ws still having diaphoresis and abnormal pulmonary exam.  So advised not to return to work yet until recheck today.  Patient states that she thinks she is doing "okay".  She still wants to crawl into bed and sleep for four days as she has no energy.  She still has a productive cough (clear mucous) and is taking Mucin-Ex once a day as she cant stop coughing (coughs up handfulls for hours).  She thinks her energy is lower but is coughing less than before.  She has been complaint with her albuterol nebulizer.  She states that she does not have a fever/chills as much as she did before.        Patient Active Problem List   Diagnosis Date Noted  . Environmental and seasonal allergies 03/12/2015  . HYPERLIPIDEMIA 09/23/2007  . OBESITY, UNSPECIFIED 09/23/2007  . ENDOMETRIOSIS, SITE UNSPECIFIED 09/23/2007  . OVARIAN CYST 09/23/2007   Past Medical History  Diagnosis Date  . Allergy   . Endometriosis   . Ovarian cyst    Past Surgical History  Procedure  Laterality Date  . Laparoscopic endometriosis fulguration  1997   Allergies  Allergen Reactions  . Latex   . Prednisone Other (See Comments)    Light headness, dizzy  . Pine Rash and Other (See Comments)    Respiratory and eye symptoms   Prior to Admission medications   Medication Sig Start Date End Date Taking? Authorizing Provider  albuterol (PROVENTIL HFA;VENTOLIN HFA) 108 (90 BASE) MCG/ACT inhaler Inhale 2 puffs into the lungs every 4 (four) hours as needed for wheezing (cough, shortness of breath or wheezing.). 04/28/12  Yes [name], PA-C  albuterol (PROVENTIL) (2.5 MG/3ML) 0.083% nebulizer solution Take 3 mLs (2.5 mg total) by nebulization every 6 (six) hours as needed for wheezing or shortness of breath. 08/07/15  Yes Sherren Mocha, MD  benzonatate (TESSALON) 200 MG capsule Take 1 capsule (200 mg total) by mouth 3 (three) times daily as needed for cough. 08/04/15  Yes Sherren Mocha, MD  chlorpheniramine-HYDROcodone [facility] PENNKINETIC ER) 10-8 MG/5ML SUER Take 5 mLs by mouth every 12 (twelve) hours as needed for cough. 08/12/15  Yes Sherren Mocha, MD  fluticasone (FLONASE) 50 MCG/ACT nasal spray Place 2 sprays into the nose daily. 04/28/12  Yes [name], PA-C  levofloxacin (LEVAQUIN) 750 MG tablet Take 1 tablet (750 mg total) by mouth daily. 08/07/15  Yes Sherren Mocha, MD  loratadine-pseudoephedrine (CLARITIN-D 24-HOUR) 10-240 MG per 24 hr tablet Take 1 tablet by mouth daily. 03/12/15  Yes Porfirio Oar, PA-C  norethindrone-ethinyl estradiol 1/35 (ORTHO-NOVUM, NORTREL,CYCLAFEM) tablet Take 1 tablet by mouth daily. take continuously, skip non-hormone containing tablets. 03/12/15  Yes [name], PA-C  oxyCODONE-acetaminophen (ROXICET) 5-325 MG tablet Take 1 tablet by mouth every 8 (eight) hours as needed (for severe cough). 08/07/15  Yes Sherren Mocha, MD   Social History   Social History  . Marital Status: Single    Spouse Name: n/a  . Number of Children: 0  . Years of Education:  21   Occupational History  . THERAPIST    Social History Main Topics  . Smoking status: Never Smoker   . Smokeless tobacco: Never Used  . Alcohol Use: No  . Drug Use: No  . Sexual Activity:    Partners: Male    Birth Control/ Protection: Pill   Other Topics Concern  . Not on file   Social History Narrative   Continues in [employer].  Looking to expand it and other contractural jobs with insurance, or look into an individual policy, effective January 2014.       Review of Systems  Constitutional: Positive for fever, chills, diaphoresis and fatigue.  Eyes: Negative for pain, redness and itching.  Respiratory: Positive for cough. Negative for choking.   Gastrointestinal: Negative for nausea and vomiting.  Musculoskeletal: Negative for neck pain and neck stiffness.  Neurological: Negative for seizures, syncope and speech difficulty.       Objective:   Physical Exam  Constitutional: She is oriented to person, place, and time. She appears well-developed and well-nourished. She appears ill. No distress.  HENT:  Head: Normocephalic and atraumatic.  Right Ear: External ear normal.  Eyes: Conjunctivae are normal. Pupils are equal, round, and reactive to light. No scleral icterus.  Neck:  Post submandibular and anterior cervical lymphadenopathy, right worse than left.   Pulmonary/Chest: Effort normal. She has rales.  inspriratory and expiratory rales in the right upper lobe but otherwise clear , some decreased air movement,    Neurological: She is alert and oriented to person, place, and time.  Skin: Skin is warm. She is diaphoretic. No erythema.  skin is still clammy and diaphoretic.   Psychiatric: She has a normal mood and affect. Her behavior is normal.    Filed Vitals:   08/14/15 0941  BP: 128/88  Pulse: 104  Temp: 98.3 F (36.8 C)  TempSrc: Oral  Resp: 20  Weight: 243 lb 6.4 oz (110.406 kg)  SpO2: 98%    Results for orders placed or performed in visit  on 08/14/15  POCT CBC  Result Value Ref Range   WBC 10.1 4.6 - 10.2 K/uL   Lymph, poc 2.6 0.6 - 3.4   POC LYMPH PERCENT 25.6 10 - 50 %L   MID (cbc) 0.8 0 - 0.9   POC MID % 8.3 0 - 12 %M   POC Granulocyte 6.7 2 - 6.9   Granulocyte percent 66.1 37 - 80 %G   RBC 5.40 4.04 - 5.48 M/uL   Hemoglobin 15.5 12.2 - 16.2 g/dL   HCT, POC 91.4 78.2 - 47.9 %   MCV 83.7 80 - 97 fL   MCH, POC 28.8 27 - 31.2 pg   MCHC 34.4 31.8 - 35.4 g/dL   RDW, POC 95.6 %   Platelet Count, POC 547 (A) 142 - 424 K/uL   MPV  6.5 0 - 99.8 fL        Assessment & Plan:   If symptoms persist or worsen at all, pt. instructed to call in so we can call Azithromycin 500 mg QD X three days.  Pt is definitely improving but she had severe bilateral bacterial pneumonia - completed course of levaquin after worsening for sev d on bactrim for sinus infxn. Is steadily improving but slow - advised pt to take sev additional days off of work - rest, hydrate.  Def will need repeat CXR in 3-4 wks to document clearance.  1. CAP (community acquired pneumonia)     Orders Placed This Encounter  Procedures  . POCT SEDIMENTATION RATE  . POCT CBC     I personally performed the services described in this documentation, which was scribed in my presence. The recorded information has been reviewed and considered, and addended by me as needed.  Nancy Sorenson, MD MPH

## 2015-08-21 ENCOUNTER — Ambulatory Visit (INDEPENDENT_AMBULATORY_CARE_PROVIDER_SITE_OTHER): Payer: BLUE CROSS/BLUE SHIELD | Admitting: Family Medicine

## 2015-08-21 ENCOUNTER — Ambulatory Visit: Payer: BLUE CROSS/BLUE SHIELD

## 2015-08-21 VITALS — BP 132/80 | HR 109 | Temp 98.0°F | Resp 20 | Ht 64.57 in | Wt 243.0 lb

## 2015-08-21 DIAGNOSIS — J189 Pneumonia, unspecified organism: Secondary | ICD-10-CM

## 2015-08-21 DIAGNOSIS — J0191 Acute recurrent sinusitis, unspecified: Secondary | ICD-10-CM | POA: Diagnosis not present

## 2015-08-21 LAB — POCT SEDIMENTATION RATE: POCT SED RATE: 45 mm/h — AB (ref 0–22)

## 2015-08-21 LAB — POCT CBC
GRANULOCYTE PERCENT: 69.5 % (ref 37–80)
HEMATOCRIT: 43.1 % (ref 37.7–47.9)
HEMOGLOBIN: 14.8 g/dL (ref 12.2–16.2)
LYMPH, POC: 2.3 (ref 0.6–3.4)
MCH: 28.8 pg (ref 27–31.2)
MCHC: 34.5 g/dL (ref 31.8–35.4)
MCV: 83.7 fL (ref 80–97)
MID (cbc): 0.5 (ref 0–0.9)
MPV: 7.1 fL (ref 0–99.8)
POC GRANULOCYTE: 6.5 (ref 2–6.9)
POC LYMPH PERCENT: 24.8 %L (ref 10–50)
POC MID %: 5.7 % (ref 0–12)
Platelet Count, POC: 489 10*3/uL — AB (ref 142–424)
RBC: 5.15 M/uL (ref 4.04–5.48)
RDW, POC: 14.4 %
WBC: 9.3 10*3/uL (ref 4.6–10.2)

## 2015-08-21 MED ORDER — IPRATROPIUM BROMIDE 0.03 % NA SOLN: 2.0000 | Freq: Four times a day (QID) | NASAL | Status: DC

## 2015-08-21 MED ORDER — GUAIFENESIN ER 1200 MG PO TB12: 1.0000 | ORAL_TABLET | Freq: Two times a day (BID) | ORAL | Status: DC | PRN

## 2015-08-21 MED ORDER — HYDROCODONE-HOMATROPINE 5-1.5 MG/5ML PO SYRP: 5.0000 mL | ORAL_SOLUTION | Freq: Four times a day (QID) | ORAL | Status: DC | PRN

## 2015-08-21 MED ORDER — AZITHROMYCIN 250 MG PO TABS: ORAL_TABLET | ORAL | Status: DC

## 2015-08-21 NOTE — Patient Instructions (Signed)
Hot showers or breathing in steam may help loosen the congestion.  Using a netti pot or sinus rinse is also likely to help you feel better and keep this from progressing.  Use the atrovent nasal spray and nasal sline as needed throughout the day and use the fluticasone nasal spray every night before bed for at least 2 weeks.  I recommend augmenting with 12 hr sudafed (behind the counter) if you are not taking the 24 hour claritin-D and generic mucinex to help you move out the congestion.  If no improvement or you are getting worse, come back as you might need a course of steroids but hopefully with all of the above, you can avoid it.  Sinusitis, Adult Sinusitis is redness, soreness, and inflammation of the paranasal sinuses. Paranasal sinuses are air pockets within the bones of your face. They are located beneath your eyes, in the middle of your forehead, and above your eyes. In healthy paranasal sinuses, mucus is able to drain out, and air is able to circulate through them by way of your nose. However, when your paranasal sinuses are inflamed, mucus and air can become trapped. This can allow bacteria and other germs to grow and cause infection. Sinusitis can develop quickly and last only a short time (acute) or continue over a long period (chronic). Sinusitis that lasts for more than 12 weeks is considered chronic. CAUSES Causes of sinusitis include:  Allergies.  Structural abnormalities, such as displacement of the cartilage that separates your nostrils (deviated septum), which can decrease the air flow through your nose and sinuses and affect sinus drainage.  Functional abnormalities, such as when the small hairs (cilia) that line your sinuses and help remove mucus do not work properly or are not present. SIGNS AND SYMPTOMS Symptoms of acute and chronic sinusitis are the same. The primary symptoms are pain and pressure around the affected sinuses. Other symptoms include:  Upper  toothache.  Earache.  Headache.  Bad breath.  Decreased sense of smell and taste.  A cough, which worsens when you are lying flat.  Fatigue.  Fever.  Thick drainage from your nose, which often is green and may contain pus (purulent).  Swelling and warmth over the affected sinuses. DIAGNOSIS Your health care provider will perform a physical exam. During your exam, your health care provider may perform any of the following to help determine if you have acute sinusitis or chronic sinusitis:  Look in your nose for signs of abnormal growths in your nostrils (nasal polyps).  Tap over the affected sinus to check for signs of infection.  View the inside of your sinuses using an imaging device that has a light attached (endoscope). If your health care provider suspects that you have chronic sinusitis, one or more of the following tests may be recommended:  Allergy tests.  Nasal culture. A sample of mucus is taken from your nose, sent to a lab, and screened for bacteria.  Nasal cytology. A sample of mucus is taken from your nose and examined by your health care provider to determine if your sinusitis is related to an allergy. TREATMENT Most cases of acute sinusitis are related to a viral infection and will resolve on their own within 10 days. Sometimes, medicines are prescribed to help relieve symptoms of both acute and chronic sinusitis. These may include pain medicines, decongestants, nasal steroid sprays, or saline sprays. However, for sinusitis related to a bacterial infection, your health care provider will prescribe antibiotic medicines. These are medicines that  will help kill the bacteria causing the infection. Rarely, sinusitis is caused by a fungal infection. In these cases, your health care provider will prescribe antifungal medicine. For some cases of chronic sinusitis, surgery is needed. Generally, these are cases in which sinusitis recurs more than 3 times per year, despite  other treatments. HOME CARE INSTRUCTIONS  Drink plenty of water. Water helps thin the mucus so your sinuses can drain more easily.  Use a humidifier.  Inhale steam 3-4 times a day (for example, sit in the bathroom with the shower running).  Apply a warm, moist washcloth to your face 3-4 times a day, or as directed by your health care provider.  Use saline nasal sprays to help moisten and clean your sinuses.  Take medicines only as directed by your health care provider.  If you were prescribed either an antibiotic or antifungal medicine, finish it all even if you start to feel better. SEEK IMMEDIATE MEDICAL CARE IF:  You have increasing pain or severe headaches.  You have nausea, vomiting, or drowsiness.  You have swelling around your face.  You have vision problems.  You have a stiff neck.  You have difficulty breathing.   This information is not intended to replace advice given to you by your health care provider. Make sure you discuss any questions you have with your health care provider.   Document Released: 07/06/2005 Document Revised: 07/27/2014 Document Reviewed: 07/21/2011 Elsevier Interactive Patient Education [employer]

## 2015-08-21 NOTE — Progress Notes (Signed)
Subjective:  By signing my name below, I, Nancy Franklin, attest that this documentation has been prepared under the direction and in the presence of Nancy Cheadle, MD. Electronically Signed: [name], [place]. 08/21/2015 , 12:25 PM .  Patient was seen in Room 7 .   Patient ID: [name], female    DOB: [birth year], 45 y.o.   MRN: [id number] Chief Complaint  Patient presents with  . Follow-up    pneumonia   HPI Nancy Franklin is a 45 y.o. female who presents to [facility] for follow up on prolonged course of pneumonia. Pt was treated several days with bactrim before worsening. diagnosed with acute bilateral lobular pneumonia, completed course of Levaquin with addition to 3 doses of rocephin 1g qd. We got her a home neb machine, using scheduled and has been out of work for extended period.  During her last check, symptoms were starting to improve slightly, but significant Franklin count markers and inflammatory.   Pt states that she has more energy, less sweats and her Franklin sugar is more stable. She notes that she has tinnitus and coughs up clear, thick-frothy phlegm. She denies seeing Franklin. Her snot is now green. She's been taking tussionex, and daytime cough syrup.   She tried to return to work this week. 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    Past Medical History  Diagnosis Date  . Allergy   . Endometriosis   . Ovarian cyst    Prior to Admission medications   Medication Sig Start Date End Date Taking? Authorizing Provider  albuterol (PROVENTIL HFA;VENTOLIN HFA) 108 (90 BASE) MCG/ACT inhaler Inhale 2 puffs into the lungs every 4 (four) hours as needed for wheezing (cough, shortness of breath or wheezing.). 04/28/12  Yes [name], PA-C  albuterol (PROVENTIL) (2.5 MG/3ML) 0.083% nebulizer solution Take 3 mLs (2.5 mg total) by nebulization every 6 (six) hours as needed for wheezing or shortness of breath. 08/07/15   Yes [name], MD  benzonatate (TESSALON) 200 MG capsule Take 1 capsule (200 mg total) by mouth 3 (three) times daily as needed for cough. 08/04/15  Yes [name], MD  fluticasone (FLONASE) 50 MCG/ACT nasal spray Place 2 sprays into the nose daily. 04/28/12  Yes [name], PA-C  loratadine-pseudoephedrine (CLARITIN-D 24-HOUR) 10-240 MG per 24 hr tablet Take 1 tablet by mouth daily. 03/12/15  Yes [name], PA-C  norethindrone-ethinyl estradiol 1/35 (ORTHO-NOVUM, NORTREL,CYCLAFEM) tablet Take 1 tablet by mouth daily. take continuously, skip non-hormone containing tablets. 03/12/15  Yes [name], PA-C  oxyCODONE-acetaminophen (ROXICET) 5-325 MG tablet Take 1 tablet by mouth every 8 (eight) hours as needed (for severe cough). 08/07/15  Yes [name], MD   Allergies  Allergen Reactions  . Latex   . Prednisone Other (See Comments)    Light headness, dizzy  . Pine Rash and Other (See Comments)    Respiratory and eye symptoms   Review of Systems  Constitutional: Negative for fever, chills, diaphoresis, appetite change and fatigue.  HENT: Positive for congestion, rhinorrhea and tinnitus. Negative for sinus pressure and sore throat.   Respiratory: Positive for cough. Negative for chest tightness, shortness of breath and wheezing.        Objective:   Physical Exam  Constitutional: She is oriented to person, place, and time. She appears well-developed and well-nourished. No distress.  HENT:  Head: Normocephalic and atraumatic.  Right Ear: Tympanic  membrane is erythematous.  Left Ear: Tympanic membrane is erythematous.  Nose: Nose normal.  Eyes: EOM are normal. Pupils are equal, round, and reactive to light.  Neck: Neck supple.  Cardiovascular: Normal rate, regular rhythm, S1 normal, S2 normal and normal heart sounds.   No murmur heard. Pulmonary/Chest: Effort normal and breath sounds normal. No respiratory distress. She has no wheezes.  Musculoskeletal: Normal range of motion.    Lymphadenopathy:    She has cervical adenopathy (anterior bilaterally).       Right: No supraclavicular adenopathy present.       Left: No supraclavicular adenopathy present.  Neurological: She is alert and oriented to person, place, and time.  Skin: Skin is warm and dry.  Psychiatric: She has a normal mood and affect. Her behavior is normal.  Nursing note and vitals reviewed.  BP 132/80 mmHg  Pulse 109  Temp(Src) 98 F (36.7 C) (Oral)  Resp 20  Ht 5' 4.57" (1.64 m)  Wt 243 lb (110.224 kg)  BMI 40.98 kg/m2  SpO2 97%  LMP 06/11/2015     Results for orders placed or performed in visit on 08/21/15  POCT CBC  Result Value Ref Range   WBC 9.3 4.6 - 10.2 K/uL   Lymph, poc 2.3 0.6 - 3.4   POC LYMPH PERCENT 24.8 10 - 50 %L   MID (cbc) 0.5 0 - 0.9   POC MID % 5.7 0 - 12 %M   POC Granulocyte 6.5 2 - 6.9   Granulocyte percent 69.5 37 - 80 %G   RBC 5.15 4.04 - 5.48 M/uL   Hemoglobin 14.8 12.2 - 16.2 g/dL   HCT, POC 43.1 37.7 - 47.9 %   MCV 83.7 80 - 97 fL   MCH, POC 28.8 27 - 31.2 pg   MCHC 34.5 31.8 - 35.4 g/dL   RDW, POC 14.4 %   Platelet Count, POC 489 (A) 142 - 424 K/uL   MPV 7.1 0 - 99.8 fL  POCT SEDIMENTATION RATE  Result Value Ref Range   POCT SED RATE 45 (A) 0 - 22 mm/hr    Assessment & Plan:   1. CAP (community acquired pneumonia) - still improving, cbc/esr continuing to improve is reassuring. Repeat cxr in 3 wks. Now worsening sinus sxs which is what predated the pna so pt is appropriately concerned now that the sxs are recurring. Initially had sev d of bactrim before changing to levaquin with rocephin 1g IM x 3d.  Has been off abx for about a week now so will cover w/ zpack.  2. Acute recurrent sinusitis, unspecified location     Orders Placed This Encounter  Procedures  . POCT CBC  . POCT SEDIMENTATION RATE    Meds ordered this encounter  Medications  . HYDROcodone-homatropine (HYCODAN) 5-1.5 MG/5ML syrup    Sig: Take 5 mLs by mouth every 6 (six) hours as  needed for cough.    Dispense:  180 mL    Refill:  0  . azithromycin (ZITHROMAX) 250 MG tablet    Sig: Take 2 tabs PO x 1 dose, then 1 tab PO QD x 4 days    Dispense:  6 tablet    Refill:  0  . Guaifenesin (MUCINEX MAXIMUM STRENGTH) 1200 MG TB12    Sig: Take 1 tablet (1,200 mg total) by mouth every 12 (twelve) hours as needed.    Dispense:  14 tablet    Refill:  1  . ipratropium (ATROVENT) 0.03 % nasal spray  Sig: Place 2 sprays into the nose 4 (four) times daily.    Dispense:  30 mL    Refill:  1    I personally performed the services described in this documentation, which was scribed in my presence. The recorded information has been reviewed and considered, and addended by me as needed.  Nancy Cheadle, MD MPH

## 2015-09-03 ENCOUNTER — Ambulatory Visit (INDEPENDENT_AMBULATORY_CARE_PROVIDER_SITE_OTHER): Payer: BLUE CROSS/BLUE SHIELD

## 2015-09-03 ENCOUNTER — Ambulatory Visit (INDEPENDENT_AMBULATORY_CARE_PROVIDER_SITE_OTHER): Payer: BLUE CROSS/BLUE SHIELD | Admitting: Family Medicine

## 2015-09-03 VITALS — BP 127/80 | HR 80 | Temp 98.6°F | Resp 18 | Ht 64.5 in | Wt 246.8 lb

## 2015-09-03 DIAGNOSIS — R05 Cough: Secondary | ICD-10-CM

## 2015-09-03 DIAGNOSIS — J301 Allergic rhinitis due to pollen: Secondary | ICD-10-CM | POA: Diagnosis not present

## 2015-09-03 DIAGNOSIS — J189 Pneumonia, unspecified organism: Secondary | ICD-10-CM

## 2015-09-03 DIAGNOSIS — J3089 Other allergic rhinitis: Secondary | ICD-10-CM | POA: Diagnosis not present

## 2015-09-03 DIAGNOSIS — R059 Cough, unspecified: Secondary | ICD-10-CM

## 2015-09-03 MED ORDER — FLUTICASONE PROPIONATE 50 MCG/ACT NA SUSP: 2.0000 | Freq: Every day | NASAL | Status: AC

## 2015-09-03 MED ORDER — AZELASTINE HCL 0.1 % NA SOLN: 1.0000 | Freq: Two times a day (BID) | NASAL | Status: DC

## 2015-09-03 MED ORDER — MONTELUKAST SODIUM 10 MG PO TABS: 10.0000 mg | ORAL_TABLET | Freq: Every day | ORAL | Status: DC

## 2015-09-03 NOTE — Patient Instructions (Addendum)
Dg Chest 2 View  09/03/2015  CLINICAL DATA:  Follow-up recent episode of pneumonia ; clinically improved but persistent cough and decreased energy level. EXAM: CHEST  2 VIEW COMPARISON:  Chest x-ray of August 07, 2015. FINDINGS: On the right the confluent alveolar opacity has resolved. There is persistent linear density consistent with thickening of the minor fissure. No discrete infiltrate remains on the right or on the left. There is no pleural effusion or pneumothorax. The heart and pulmonary vascularity are normal. IMPRESSION: Interval resolution of the inferior right upper lobe infiltrate and patchy infiltrate at the left lung base. Persistent linear density in the region of the minor fissure likely reflecting minimal residual atelectasis and trace pleural fluid. An additional follow-up chest x-ray in 2-3 weeks is recommended to assure complete clearing. Electronically Signed   By: David  [place] M.D.   On: 09/03/2015 11:29   Dg Chest 2 View  08/07/2015  CLINICAL DATA:  Cough, shortness of breath. EXAM: CHEST  2 VIEW COMPARISON:  None available currently. FINDINGS: The heart size and mediastinal contours are within normal limits. Hypoinflation of the lungs is noted. Mild left basilar opacity is noted most consistent with subsegmental atelectasis. Large right upper lobe airspace opacity is noted most consistent with pneumonia. No pneumothorax or significant pleural effusion is noted. The visualized skeletal structures are unremarkable. IMPRESSION: Large right upper lobe airspace opacity is noted most consistent with pneumonia. Followup radiograph in 3-4 weeks after trial of antibiotic therapy is recommended to ensure resolution and rule out underlying neoplasm. Electronically Signed   By: Lupita Raider, M.D.   On: 08/07/2015 10:53   Cont mucinex and prn tessalon Cont qhs nasal steroid and antihistamine Cont netti pot  Recheck in 2-3 weeks    Because you received an x-ray today, you will receive an  invoice from [facility]. Please contact [facility] at [phone] with questions or concerns regarding your invoice. Our billing staff will not be able to assist you with those questions.

## 2015-09-03 NOTE — Progress Notes (Signed)
Subjective:    Patient ID: Nancy Franklin, female    DOB: Feb 02, 1971, 45 y.o.   MRN: [id number] Chief Complaint  Patient presents with  . Follow-up    PNEUMONIA    HPI  Is doing much better but still has a little cough and is still not ocpmletely back to her baseline as far as her energy goes.  Gets severe cough and fatigue in the evenings still.  Has been back to work but has been limiting herself a little - giving herself little breaks during the day.   Cough is productive only half-the time - more productive after eating or breathing treatment.  Still doing neb machine about 3-4x/d which is helping.  Has completed the 3rd antibiotic course - finished the bactrim that we started initially.  Occ some central chest heaviness and tighntess which mucinex does help with.  She has been around a ton of sick people.  Past Medical History  Diagnosis Date  . Allergy   . Endometriosis   . Ovarian cyst    Current Outpatient Prescriptions on File Prior to Visit  Medication Sig Dispense Refill  . albuterol (PROVENTIL HFA;VENTOLIN HFA) 108 (90 BASE) MCG/ACT inhaler Inhale 2 puffs into the lungs every 4 (four) hours as needed for wheezing (cough, shortness of breath or wheezing.). 3 Inhaler 0  . albuterol (PROVENTIL) (2.5 MG/3ML) 0.083% nebulizer solution Take 3 mLs (2.5 mg total) by nebulization every 6 (six) hours as needed for wheezing or shortness of breath. 150 mL 1  . benzonatate (TESSALON) 200 MG capsule Take 1 capsule (200 mg total) by mouth 3 (three) times daily as needed for cough. 40 capsule 0  . fluticasone (FLONASE) 50 MCG/ACT nasal spray Place 2 sprays into the nose daily. 54 g 3  . Guaifenesin (MUCINEX MAXIMUM STRENGTH) 1200 MG TB12 Take 1 tablet (1,200 mg total) by mouth every 12 (twelve) hours as needed. 14 tablet 1  . HYDROcodone-homatropine (HYCODAN) 5-1.5 MG/5ML syrup Take 5 mLs by mouth every 6 (six) hours as needed for cough. 180 mL 0  . ipratropium (ATROVENT) 0.03 % nasal spray  Place 2 sprays into the nose 4 (four) times daily. 30 mL 1  . loratadine-pseudoephedrine (CLARITIN-D 24-HOUR) 10-240 MG per 24 hr tablet Take 1 tablet by mouth daily. 90 tablet 4  . norethindrone-ethinyl estradiol 1/35 (ORTHO-NOVUM, NORTREL,CYCLAFEM) tablet Take 1 tablet by mouth daily. take continuously, skip non-hormone containing tablets. 4 Package 4   No current facility-administered medications on file prior to visit.   Allergies  Allergen Reactions  . Latex   . Prednisone Other (See Comments)    Light headness, dizzy  . Pine Rash and Other (See Comments)    Respiratory and eye symptoms     Review of Systems  Constitutional: Positive for activity change and fatigue. Negative for fever, chills, diaphoresis and appetite change.  HENT: Positive for congestion, postnasal drip and rhinorrhea. Negative for ear discharge, ear pain, nosebleeds, sinus pressure, sneezing, sore throat, trouble swallowing and voice change.   Eyes: Negative for discharge and itching.  Respiratory: Positive for cough. Negative for shortness of breath.   Cardiovascular: Negative for chest pain.  Gastrointestinal: Negative for nausea, vomiting and abdominal pain.  Musculoskeletal: Negative for neck pain and neck stiffness.  Skin: Negative for rash.  Neurological: Positive for weakness and headaches. Negative for dizziness and syncope.  Hematological: Negative for adenopathy.  Psychiatric/Behavioral: Positive for sleep disturbance.       Objective:  BP 127/80 mmHg  Pulse  80  Temp(Src) 98.6 F (37 C) (Oral)  Resp 18  Ht 5' 4.5" (1.638 m)  Wt 246 lb 12.8 oz (111.948 kg)  BMI 41.72 kg/m2  LMP 06/11/2015  Physical Exam  Constitutional: She is oriented to person, place, and time. She appears well-developed and well-nourished. She does not appear ill. No distress.  HENT:  Head: Normocephalic and atraumatic.  Right Ear: External ear and ear canal normal. Tympanic membrane is not retracted. No middle ear  effusion.  Left Ear: External ear and ear canal normal. Tympanic membrane is not retracted.  No middle ear effusion.  Nose: Mucosal edema and rhinorrhea present.  Mouth/Throat: Uvula is midline and mucous membranes are normal. No oropharyngeal exudate, posterior oropharyngeal edema, posterior oropharyngeal erythema or tonsillar abscesses.  + pnd  Eyes: Conjunctivae are normal. Right eye exhibits no discharge. Left eye exhibits no discharge. No scleral icterus.  Neck: Normal range of motion. Neck supple.  Cardiovascular: Normal rate, regular rhythm, normal heart sounds and intact distal pulses.   Pulmonary/Chest: Effort normal and breath sounds normal.  Lymphadenopathy:       Head (right side): No submandibular, no preauricular and no posterior auricular adenopathy present.       Head (left side): No submandibular, no preauricular and no posterior auricular adenopathy present.    She has no cervical adenopathy.       Right: No supraclavicular adenopathy present.       Left: No supraclavicular adenopathy present.  Neurological: She is alert and oriented to person, place, and time. Gait normal.  Skin: Skin is warm and dry. She is not diaphoretic. No erythema.  Psychiatric: She has a normal mood and affect. Her behavior is normal.     Dg Chest 2 View  09/03/2015  CLINICAL DATA:  Follow-up recent episode of pneumonia ; clinically improved but persistent cough and decreased energy level. EXAM: CHEST  2 VIEW COMPARISON:  Chest x-ray of August 07, 2015. FINDINGS: On the right the confluent alveolar opacity has resolved. There is persistent linear density consistent with thickening of the minor fissure. No discrete infiltrate remains on the right or on the left. There is no pleural effusion or pneumothorax. The heart and pulmonary vascularity are normal. IMPRESSION: Interval resolution of the inferior right upper lobe infiltrate and patchy infiltrate at the left lung base. Persistent linear density in  the region of the minor fissure likely reflecting minimal residual atelectasis and trace pleural fluid. An additional follow-up chest x-ray in 2-3 weeks is recommended to assure complete clearing. Electronically Signed   By: David  [place] M.D.   On: 09/03/2015 11:29   Dg Chest 2 View  08/07/2015  CLINICAL DATA:  Cough, shortness of breath. EXAM: CHEST  2 VIEW COMPARISON:  None available currently. FINDINGS: The heart size and mediastinal contours are within normal limits. Hypoinflation of the lungs is noted. Mild left basilar opacity is noted most consistent with subsegmental atelectasis. Large right upper lobe airspace opacity is noted most consistent with pneumonia. No pneumothorax or significant pleural effusion is noted. The visualized skeletal structures are unremarkable. IMPRESSION: Large right upper lobe airspace opacity is noted most consistent with pneumonia. Followup radiograph in 3-4 weeks after trial of antibiotic therapy is recommended to ensure resolution and rule out underlying neoplasm. Electronically Signed   By: Lupita Raider, M.D.   On: 08/07/2015 10:53    Assessment & Plan:   1. Cough   2. CAP (community acquired pneumonia)   Has had 3 courses of antibiotics  for this severe illness.   Cont mucinex and prn tessalon Cont qhs nasal steroid and antihistamine Cont netti pot  Recheck in 2-3 weeks  Orders Placed This Encounter  Procedures  . DG Chest 2 View    Standing Status: Future     Number of Occurrences: 1     Standing Expiration Date: 09/02/2016    Order Specific Question:  Reason for Exam (SYMPTOM  OR DIAGNOSIS REQUIRED)    Answer:  f/u pneumonia, still coughing at night    Order Specific Question:  Is the patient pregnant?    Answer:  No    Order Specific Question:  Preferred imaging location?    Answer:  External    Norberto Sorenson, MD MPH

## 2015-09-05 ENCOUNTER — Telehealth: Payer: Self-pay

## 2015-09-05 NOTE — Telephone Encounter (Signed)
Pt is still out with double pnemonia and is wanting to know from dr Clelia Croft what else might she do while at home to spead up the healing process and how will she know she is able to return to work   [employer] number [phone]

## 2015-09-06 NOTE — Telephone Encounter (Signed)
???    She was seen in clinic sev d agowhere we discussed this and she was improving - she was starting back to work slowly and fine to continue to work as tolerated.  Sleep, hand hygiene, avoid public places and careful about personal space and try not to touch face before washing hands, pulmonary hygiene with deep breathing, breathing in through a straw, try to blow pinwheel, healthy diet with plenty of fresh fruits and veggies, keep well hydrated with water, consider humidifier, consider allergy meds if environmental allergies. . . .

## 2015-09-11 ENCOUNTER — Telehealth: Payer: Self-pay

## 2015-09-11 NOTE — Telephone Encounter (Signed)
Called and lmom - apologized for not getting back to her sooner. Advised to call back if symptoms not improving. No need to call back if she is better.

## 2015-09-11 NOTE — Telephone Encounter (Signed)
Pt is still coughing at night and pain in her chest -when she was here recently it was possible of a collapsed lung and she is concerned  [employer] number [phone]

## 2015-09-12 NOTE — Telephone Encounter (Signed)
Dr. Shaw  Please see previous message 

## 2015-09-13 NOTE — Telephone Encounter (Signed)
Atelectasis is a little different than a collapsed lung like a pneumorthorax (which is what most people feel like) - if not fixed by deeping breathing, coughing, exercise, and blowing out through pursed lips than it can increase risk of repeat infection.  It can take 3-4 weeks AFTER resolution of a pneumonia for cough and chest wall pain from coughing to resolve but there is no way for me know if that is all that it is without repeat exam so if Nancy Franklin is still feeling poorly or sxs persist beyond that time frame, Nancy Franklin needs to be seen and consider need for a chest CT.

## 2015-09-16 MED FILL — DASETTA 1-35-28 TABLET: 1-35 | 84 days supply | Qty: 112 | Fill #2

## 2015-09-18 ENCOUNTER — Ambulatory Visit (INDEPENDENT_AMBULATORY_CARE_PROVIDER_SITE_OTHER): Payer: BLUE CROSS/BLUE SHIELD

## 2015-09-18 ENCOUNTER — Ambulatory Visit (INDEPENDENT_AMBULATORY_CARE_PROVIDER_SITE_OTHER): Payer: BLUE CROSS/BLUE SHIELD | Admitting: Family Medicine

## 2015-09-18 VITALS — BP 114/80 | HR 93 | Temp 98.0°F | Resp 20 | Ht 64.57 in | Wt 243.0 lb

## 2015-09-18 DIAGNOSIS — J984 Other disorders of lung: Secondary | ICD-10-CM

## 2015-09-18 DIAGNOSIS — Z9109 Other allergy status, other than to drugs and biological substances: Secondary | ICD-10-CM

## 2015-09-18 DIAGNOSIS — Z8261 Family history of arthritis: Secondary | ICD-10-CM

## 2015-09-18 DIAGNOSIS — J189 Pneumonia, unspecified organism: Secondary | ICD-10-CM | POA: Diagnosis not present

## 2015-09-18 DIAGNOSIS — J9811 Atelectasis: Secondary | ICD-10-CM

## 2015-09-18 DIAGNOSIS — Z91048 Other nonmedicinal substance allergy status: Secondary | ICD-10-CM | POA: Diagnosis not present

## 2015-09-18 LAB — CBC WITH DIFFERENTIAL/PLATELET
BASOS PCT: 0 % (ref 0–1)
Basophils Absolute: 0 10*3/uL (ref 0.0–0.1)
EOS PCT: 2 % (ref 0–5)
Eosinophils Absolute: 0.2 10*3/uL (ref 0.0–0.7)
HEMATOCRIT: 43.1 % (ref 36.0–46.0)
HEMOGLOBIN: 14.9 g/dL (ref 12.0–15.0)
Lymphocytes Relative: 25 % (ref 12–46)
Lymphs Abs: 2.4 10*3/uL (ref 0.7–4.0)
MCH: 29.5 pg (ref 26.0–34.0)
MCHC: 34.6 g/dL (ref 30.0–36.0)
MCV: 85.3 fL (ref 78.0–100.0)
MONO ABS: 0.8 10*3/uL (ref 0.1–1.0)
MONOS PCT: 8 % (ref 3–12)
MPV: 9.5 fL (ref 8.6–12.4)
NEUTROS ABS: 6.2 10*3/uL (ref 1.7–7.7)
Neutrophils Relative %: 65 % (ref 43–77)
Platelets: 445 10*3/uL — ABNORMAL HIGH (ref 150–400)
RBC: 5.05 MIL/uL (ref 3.87–5.11)
RDW: 14.5 % (ref 11.5–15.5)
WBC: 9.6 10*3/uL (ref 4.0–10.5)

## 2015-09-18 LAB — POCT SEDIMENTATION RATE: POCT SED RATE: 11 mm/h (ref 0–22)

## 2015-09-18 LAB — COMPREHENSIVE METABOLIC PANEL
ALBUMIN: 4.1 g/dL (ref 3.6–5.1)
ALT: 24 U/L (ref 6–29)
AST: 21 U/L (ref 10–30)
Alkaline Phosphatase: 58 U/L (ref 33–115)
BUN: 9 mg/dL (ref 7–25)
CHLORIDE: 105 mmol/L (ref 98–110)
CO2: 20 mmol/L (ref 20–31)
Calcium: 9.2 mg/dL (ref 8.6–10.2)
Creat: 0.74 mg/dL (ref 0.50–1.10)
Glucose, Bld: 127 mg/dL — ABNORMAL HIGH (ref 65–99)
POTASSIUM: 4.4 mmol/L (ref 3.5–5.3)
Sodium: 139 mmol/L (ref 135–146)
TOTAL PROTEIN: 7.2 g/dL (ref 6.1–8.1)
Total Bilirubin: 0.4 mg/dL (ref 0.2–1.2)

## 2015-09-18 LAB — C-REACTIVE PROTEIN: CRP: 0.7 mg/dL — AB (ref <0.60)

## 2015-09-18 LAB — TSH: TSH: 4.93 m[IU]/L — AB

## 2015-09-18 MED ORDER — MELOXICAM 15 MG PO TABS: 15.0000 mg | ORAL_TABLET | Freq: Every day | ORAL | Status: DC

## 2015-09-18 MED ORDER — OMEPRAZOLE 40 MG PO CPDR: 40.0000 mg | DELAYED_RELEASE_CAPSULE | Freq: Every day | ORAL | Status: DC

## 2015-09-18 MED ORDER — ALBUTEROL SULFATE (2.5 MG/3ML) 0.083% IN NEBU
2.5000 mg | INHALATION_SOLUTION | Freq: Once | RESPIRATORY_TRACT | Status: AC
  Administered 2015-09-18: 2.5 mg via RESPIRATORY_TRACT

## 2015-09-18 MED ORDER — FLUTICASONE-SALMETEROL 500-50 MCG/DOSE IN AEPB: 1.0000 | INHALATION_SPRAY | Freq: Two times a day (BID) | RESPIRATORY_TRACT | Status: DC

## 2015-09-18 MED ORDER — IPRATROPIUM BROMIDE 0.02 % IN SOLN
0.5000 mg | Freq: Once | RESPIRATORY_TRACT | Status: AC
  Administered 2015-09-18: 0.5 mg via RESPIRATORY_TRACT

## 2015-09-18 NOTE — Progress Notes (Signed)
Subjective:  By signing my name below, I, Moises Blood, attest that this documentation has been prepared under the direction and in the presence of [name], MD. Electronically Signed: Moises Blood, [place]. 09/18/2015 , 8:31 AM .  Patient was seen in Room 1 .   Patient ID: Nancy Franklin, female    DOB: August 20, 1970, 45 y.o.   MRN: [id number] Chief Complaint  Patient presents with  . Follow-up    pneumonia    HPI Nancy Franklin is a 45 y.o. female who presents to [facility] for follow up on pneumonia. She's been ill for approximately 6 weeks, initially with sinus infection followed by CAP several days later. She was treated with a course of levaquin and her symptoms steadily improved but gradual. That was followed by zpak for 5 days which was followed by bactrim, which patient had at home from prior. Last saw her 2 weeks ago, where she continues to slowly improve, and was able to go back to work; still have productive cough and uses nebulizer machine 3-4 times a day as well as a neti pot. Repeat chest xray at that time showed resolution of right upper lobe and left lung base, but persistent linear density in minor fissure; thought to reflect minimal residual atelectasis and trace pleural fluid. She is to start pulmonary toileting and recheck in 2-3 weeks for repeat chest xray. Patient has no chronic underlying lung disease, but reports history of respiratory illnesses.   She's doing better but still doesn't feel 100%. 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 She had to lay down to catch a breath. She tried to see clients 2 days ago but was drained out. She was able to cough up phlegm afterwards. She's still fatigue and feeling drained of stamina with some chest tightness. She's still using her rescue inhaler and also nebulizer treatments at home. She denies night sweats.   Past Medical History  Diagnosis Date  . Allergy   . Endometriosis   . Ovarian cyst     Prior to Admission medications   Medication Sig Start Date End Date Taking? Authorizing Provider  albuterol (PROVENTIL HFA;VENTOLIN HFA) 108 (90 BASE) MCG/ACT inhaler Inhale 2 puffs into the lungs every 4 (four) hours as needed for wheezing (cough, shortness of breath or wheezing.). 04/28/12   [name], PA-C  albuterol (PROVENTIL) (2.5 MG/3ML) 0.083% nebulizer solution Take 3 mLs (2.5 mg total) by nebulization every 6 (six) hours as needed for wheezing or shortness of breath. 08/07/15   [name], MD  azelastine (ASTELIN) 0.1 % nasal spray Place 1 spray into both nostrils 2 (two) times daily. Use in each nostril as directed 09/03/15   [name], MD  benzonatate (TESSALON) 200 MG capsule Take 1 capsule (200 mg total) by mouth 3 (three) times daily as needed for cough. 08/04/15   [name], MD  fluticasone (FLONASE) 50 MCG/ACT nasal spray Place 2 sprays into both nostrils at bedtime. 09/03/15   [name], MD  Guaifenesin [facility] MAXIMUM STRENGTH) 1200 MG TB12 Take 1 tablet (1,200 mg total) by mouth every 12 (twelve) hours as needed. 08/21/15   [name], MD  HYDROcodone-homatropine [facility]) 5-1.5 MG/5ML syrup Take 5 mLs by mouth every 6 (six) hours as needed for cough. 08/21/15   [name], MD  ipratropium (ATROVENT) 0.03 % nasal spray Place 2 sprays into the nose 4 (four) times daily. 08/21/15  [name], MD  loratadine-pseudoephedrine (CLARITIN-D 24-HOUR) 10-240 MG per 24 hr tablet Take 1 tablet by mouth daily. 03/12/15   [name], PA-C  montelukast (SINGULAIR) 10 MG tablet Take 1 tablet (10 mg total) by mouth at bedtime. 09/03/15   [name], MD  norethindrone-ethinyl estradiol 1/35 (ORTHO-NOVUM, NORTREL,CYCLAFEM) tablet Take 1 tablet by mouth daily. take continuously, skip non-hormone containing tablets. 03/12/15   [name], PA-C   Allergies  Allergen Reactions  . Latex   . Prednisone Other (See Comments)    Light headness, dizzy  . Pine Rash and Other (See Comments)     Respiratory and eye symptoms    Review of Systems  Constitutional: Positive for activity change and fatigue. Negative for fever, chills and diaphoresis.  HENT: Positive for congestion. Negative for rhinorrhea, sinus pressure and sore throat.   Respiratory: Positive for cough and shortness of breath. Negative for wheezing.   Gastrointestinal: Negative for nausea and vomiting.      Objective:   Physical Exam  Constitutional: She is oriented to person, place, and time. She appears well-developed and well-nourished. No distress.  HENT:  Head: Normocephalic and atraumatic.  Eyes: EOM are normal. Pupils are equal, round, and reactive to light.  Neck: Neck supple.  Cardiovascular: Normal rate.   Pulmonary/Chest: Effort normal. No respiratory distress. She has decreased breath sounds.  Musculoskeletal: Normal range of motion.  Neurological: She is alert and oriented to person, place, and time.  Skin: Skin is warm and dry.  Psychiatric: She has a normal mood and affect. Her behavior is normal.  Nursing note and vitals reviewed.  BP 114/80 mmHg  Pulse 93  Temp(Src) 98 F (36.7 C) (Oral)  Resp 20  Ht 5' 4.57" (1.64 m)  Wt 243 lb (110.224 kg)  BMI 40.98 kg/m2  SpO2 98%  LMP 09/11/2015   Dg Chest 2 View  09/18/2015  CLINICAL DATA:  Bilateral pneumonia.  Follow-up. EXAM: CHEST  2 VIEW COMPARISON:  09/03/2015 FINDINGS: Persistent linear density in the right mid lung compatible with subsegmental atelectasis, stable. Left lung is clear. Heart is normal size. No effusions. No acute bony abnormality. IMPRESSION: Stable right mid atelectasis.  No acute findings. Electronically Signed   By: Rolm Baptise M.D.   On: 09/18/2015 09:06   Dg Chest 2 View  09/03/2015  CLINICAL DATA:  Follow-up recent episode of pneumonia ; clinically improved but persistent cough and decreased energy level. EXAM: CHEST  2 VIEW COMPARISON:  Chest x-ray of August 07, 2015. FINDINGS: On the right the confluent alveolar  opacity has resolved. There is persistent linear density consistent with thickening of the minor fissure. No discrete infiltrate remains on the right or on the left. There is no pleural effusion or pneumothorax. The heart and pulmonary vascularity are normal. IMPRESSION: Interval resolution of the inferior right upper lobe infiltrate and patchy infiltrate at the left lung base. Persistent linear density in the region of the minor fissure likely reflecting minimal residual atelectasis and trace pleural fluid. An additional follow-up chest x-ray in 2-3 weeks is recommended to assure complete clearing. Electronically Signed   By: David  Martinique M.D.   On: 09/03/2015 11:29      Assessment & Plan:   1. Bilateral pneumonia - was diagnosed 6 wks ago and has been treated with 3 courses of various abx (azithro, bactrim, levaquin) but atelectasis still present so will proceed with CT scan and pulm referral to see if there are any underlying abnormalities/exacerbating sxs to  explain such a severe infection with prolonged course  2. Environmental allergies   3. Atelectasis   4. Restrictive airway disease - start trial of inhaler, still using alb neb treatment at least 4x/d.  5. Family history of rheumatoid arthritis - pt notes a wide variety of odd rheumatoid sxs her whole life so would like a rheum c/s which could be enlightening for pt.  Start ppi to remove any laryngial reflux sxs complicating picture  Orders Placed This Encounter  Procedures  . DG Chest 2 View    Standing Status: Future     Number of Occurrences: 1     Standing Expiration Date: 09/17/2016    Order Specific Question:  Reason for Exam (SYMPTOM  OR DIAGNOSIS REQUIRED)    Answer:  continued SHoB, f/u pna    Order Specific Question:  Is the patient pregnant?    Answer:  No    Order Specific Question:  Preferred imaging location?    Answer:  External  . CT Chest W Contrast    240 lbs/ no needs / no diabetic / no hx of kidney cancer kidney dz  or solitary kidney / no needs /  No allergy to iv contrast / bcbs/ pt mdc     Standing Status: Future     Number of Occurrences:      Standing Expiration Date: 11/17/2016    Order Specific Question:  If indicated for the ordered procedure, I authorize the administration of contrast media per Radiology protocol    Answer:  Yes    Order Specific Question:  Reason for Exam (SYMPTOM  OR DIAGNOSIS REQUIRED)    Answer:  persistent cough and shob persistent 6 weeks after pneumonia    Order Specific Question:  Is the patient pregnant?    Answer:  No    Order Specific Question:  Preferred imaging location?    Answer:  [facility]  . CBC with Differential/Platelet  . Comprehensive metabolic panel  . TSH  . C-reactive protein  . Pathologist smear review  . ANA  . Ambulatory referral to Pulmonology    Referral Priority:  Routine    Referral Type:  Consultation    Referral Reason:  Specialty Services Required    Requested Specialty:  Pulmonary Disease    Number of Visits Requested:  1  . Ambulatory referral to Rheumatology    Referral Priority:  Routine    Referral Type:  Consultation    Referral Reason:  Specialty Services Required    Requested Specialty:  Rheumatology    Number of Visits Requested:  1  . POCT SEDIMENTATION RATE    Meds ordered this encounter  Medications  . albuterol (PROVENTIL) (2.5 MG/3ML) 0.083% nebulizer solution 2.5 mg    Sig:   . ipratropium (ATROVENT) nebulizer solution 0.5 mg    Sig:   . DISCONTD: Fluticasone-Salmeterol (ADVAIR DISKUS) 500-50 MCG/DOSE AEPB    Sig: Inhale 1 puff into the lungs 2 (two) times daily.    Dispense:  60 each    Refill:  1  . meloxicam (MOBIC) 15 MG tablet    Sig: Take 1 tablet (15 mg total) by mouth daily.    Dispense:  30 tablet    Refill:  1  . DISCONTD: omeprazole (PRILOSEC) 40 MG capsule    Sig: Take 1 capsule (40 mg total) by mouth daily.    Dispense:  30 capsule    Refill:  3    I personally performed the  services described  in this documentation, which was scribed in my presence. The recorded information has been reviewed and considered, and addended by me as needed.  [name], MD MPH  Results for orders placed or performed in visit on 09/18/15  CBC with Differential/Platelet  Result Value Ref Range   WBC 9.6 4.0 - 10.5 K/uL   RBC 5.05 3.87 - 5.11 MIL/uL   Hemoglobin 14.9 12.0 - 15.0 g/dL   HCT 43.1 36.0 - 46.0 %   MCV 85.3 78.0 - 100.0 fL   MCH 29.5 26.0 - 34.0 pg   MCHC 34.6 30.0 - 36.0 g/dL   RDW 14.5 11.5 - 15.5 %   Platelets 445 (H) 150 - 400 K/uL   MPV 9.5 8.6 - 12.4 fL   Neutrophils Relative % 65 43 - 77 %   Neutro Abs 6.2 1.7 - 7.7 K/uL   Lymphocytes Relative 25 12 - 46 %   Lymphs Abs 2.4 0.7 - 4.0 K/uL   Monocytes Relative 8 3 - 12 %   Monocytes Absolute 0.8 0.1 - 1.0 K/uL   Eosinophils Relative 2 0 - 5 %   Eosinophils Absolute 0.2 0.0 - 0.7 K/uL   Basophils Relative 0 0 - 1 %   Basophils Absolute 0.0 0.0 - 0.1 K/uL   Smear Review Criteria for review not met   Comprehensive metabolic panel  Result Value Ref Range   Sodium 139 135 - 146 mmol/L   Potassium 4.4 3.5 - 5.3 mmol/L   Chloride 105 98 - 110 mmol/L   CO2 20 20 - 31 mmol/L   Glucose, Bld 127 (H) 65 - 99 mg/dL   BUN 9 7 - 25 mg/dL   Creat 0.74 0.50 - 1.10 mg/dL   Total Bilirubin 0.4 0.2 - 1.2 mg/dL   Alkaline Phosphatase 58 33 - 115 U/L   AST 21 10 - 30 U/L   ALT 24 6 - 29 U/L   Total Protein 7.2 6.1 - 8.1 g/dL   Albumin 4.1 3.6 - 5.1 g/dL   Calcium 9.2 8.6 - 10.2 mg/dL  TSH  Result Value Ref Range   TSH 4.93 (H) mIU/L  C-reactive protein  Result Value Ref Range   CRP 0.7 (H) <0.60 mg/dL  Pathologist smear review  Result Value Ref Range   Path Review SEE NOTE   ANA  Result Value Ref Range   Anit Nuclear Antibody(ANA) NEG NEGATIVE  POCT SEDIMENTATION RATE  Result Value Ref Range   POCT SED RATE 11 0 - 22 mm/hr

## 2015-09-18 NOTE — Patient Instructions (Addendum)
Because you received an x-ray today, you will receive an invoice from [facility]. Please contact [facility] at [phone] with questions or concerns regarding your invoice. Our billing staff will not be able to assist you with those questions.  Start the meloxicam every morning (do not pay more than $4). Keep on with the mucinex.  Use delsym and tessalon for your cough, hycodan on the nights you have had a hard day and need sleep.   Start advair twice a day. Continue with the claritin D and other allergy meds. Continue on the flonase. Start the omeprazole 30 minutes before a meal.  Asthma, Acute Bronchospasm Acute bronchospasm caused by asthma is also referred to as an asthma attack. Bronchospasm means your air passages become narrowed. The narrowing is caused by inflammation and tightening of the muscles in the air tubes (bronchi) in your lungs. This can make it hard to breathe or cause you to wheeze and cough. CAUSES Possible triggers are:  Animal dander from the skin, hair, or feathers of animals.  Dust mites contained in house dust.  Cockroaches.  Pollen from trees or grass.  Mold.  Cigarette or tobacco smoke.  Air pollutants such as dust, household cleaners, hair sprays, aerosol sprays, paint fumes, strong chemicals, or strong odors.  Cold air or weather changes. Cold air may trigger inflammation. Winds increase molds and pollens in the air.  Strong emotions such as crying or laughing hard.  Stress.  Certain medicines such as aspirin or beta-blockers.  Sulfites in foods and drinks, such as dried fruits and wine.  Infections or inflammatory conditions, such as a flu, cold, or inflammation of the nasal membranes (rhinitis).  Gastroesophageal reflux disease (GERD). GERD is a condition where stomach acid backs up into your esophagus.  Exercise or strenuous activity. SIGNS AND SYMPTOMS   Wheezing.  Excessive coughing, particularly at  night.  Chest tightness.  Shortness of breath. DIAGNOSIS  Your health care provider will ask you about your medical history and perform a physical exam. A chest X-ray or blood testing may be performed to look for other causes of your symptoms or other conditions that may have triggered your asthma attack. TREATMENT  Treatment is aimed at reducing inflammation and opening up the airways in your lungs. Most asthma attacks are treated with inhaled medicines. These include quick relief or rescue medicines (such as bronchodilators) and controller medicines (such as inhaled corticosteroids). These medicines are sometimes given through an inhaler or a nebulizer. Systemic steroid medicine taken by mouth or given through an IV tube also can be used to reduce the inflammation when an attack is moderate or severe. Antibiotic medicines are only used if a bacterial infection is present.  HOME CARE INSTRUCTIONS   Rest.  Drink plenty of liquids. This helps the mucus to remain thin and be easily coughed up. Only use caffeine in moderation and do not use alcohol until you have recovered from your illness.  Do not smoke. Avoid being exposed to secondhand smoke.  You play a critical role in keeping yourself in good health. Avoid exposure to things that cause you to wheeze or to have breathing problems.  Keep your medicines up-to-date and available. Carefully follow your health care provider's treatment plan.  Take your medicine exactly as prescribed.  When pollen or pollution is bad, keep windows closed and use an air conditioner or go to places with air conditioning.  Asthma requires careful medical care. See your health care provider for a follow-up as advised.  If you are more than [redacted] weeks pregnant and you were prescribed any new medicines, let your obstetrician know about the visit and how you are doing. Follow up with your health care provider as directed.  After you have recovered from your asthma  attack, make an appointment with your outpatient doctor to talk about ways to reduce the likelihood of future attacks. If you do not have a doctor who manages your asthma, make an appointment with a primary care doctor to discuss your asthma. SEEK IMMEDIATE MEDICAL CARE IF:   You are getting worse.  You have trouble breathing. If severe, call your local emergency services (911 in the [place]  You develop chest pain or discomfort.  You are vomiting.  You are not able to keep fluids down.  You are coughing up yellow, green, brown, or bloody sputum.  You have a fever and your symptoms suddenly get worse.  You have trouble swallowing. MAKE SURE YOU:   Understand these instructions.  Will watch your condition.  Will get help right away if you are not doing well or get worse.   This information is not intended to replace advice given to you by your health care provider. Make sure you discuss any questions you have with your health care provider.   Document Released: 10/21/2006 Document Revised: 07/11/2013 Document Reviewed: 01/11/2013 Elsevier Interactive Patient Education [employer]

## 2015-09-19 LAB — PATHOLOGIST SMEAR REVIEW

## 2015-09-19 LAB — ANA: ANA: NEGATIVE

## 2015-09-20 ENCOUNTER — Encounter: Payer: Self-pay | Admitting: Internal Medicine

## 2015-09-20 ENCOUNTER — Ambulatory Visit (INDEPENDENT_AMBULATORY_CARE_PROVIDER_SITE_OTHER): Payer: BLUE CROSS/BLUE SHIELD | Admitting: Internal Medicine

## 2015-09-20 VITALS — BP 136/84 | HR 95 | Ht 62.0 in | Wt 244.6 lb

## 2015-09-20 DIAGNOSIS — J189 Pneumonia, unspecified organism: Secondary | ICD-10-CM | POA: Insufficient documentation

## 2015-09-20 DIAGNOSIS — J45991 Cough variant asthma: Secondary | ICD-10-CM | POA: Diagnosis not present

## 2015-09-20 MED ORDER — MOMETASONE FURO-FORMOTEROL FUM 100-5 MCG/ACT IN AERO: INHALATION_SPRAY | RESPIRATORY_TRACT | Status: DC

## 2015-09-20 MED ORDER — OMEPRAZOLE 40 MG PO CPDR: DELAYED_RELEASE_CAPSULE | ORAL | Status: DC

## 2015-09-20 NOTE — Patient Instructions (Addendum)
Plan A = Automatic = Dulera 100 Take 2 puffs first thing in am and then another 2 puffs about 12 hours later                                     Prilosec 40 mg Take 30- 60 min before your first and last meals of the day                                      Delsym 2 tsp every 12 hours     Plan B = Backup For breathing >   ok to use the nebulizer up to every 4 hours if you can't catch your breath  For cough >  Tessalon up to every 4 hours and if not better then tussionex  For drainage / throat tickle try take CHLORPHENIRAMINE  4 mg - take one every 4 hours as needed - available over the counter- may cause drowsiness so start with just a bedtime dose or two and see how you tolerate it before trying in daytime    Stop advair and mucinex  GERD (REFLUX)  is an extremely common cause of respiratory symptoms just like yours , many times with no obvious heartburn at all.    It can be treated with medication, but also with lifestyle changes including elevation of the head of your bed (ideally with 6 inch  bed blocks),  Smoking cessation, avoidance of late meals, excessive alcohol, and avoid fatty foods, chocolate, peppermint, colas, red wine, and acidic juices such as orange juice.  NO MINT OR MENTHOL PRODUCTS SO NO COUGH DROPS  USE SUGARLESS CANDY INSTEAD (Jolley ranchers or Stover's or Life Savers) or even ice chips will also do - the key is to swallow to prevent all throat clearing. NO OIL BASED VITAMINS - use powdered substitutes.   Please schedule a follow up office visit in 2 weeks, sooner if needed

## 2015-09-20 NOTE — Assessment & Plan Note (Addendum)
Resolved p approp out pt rx , no further abx needed, may CT chest and sinus at some point if cough persists looking for bronchiectasis or sinusitis respectively

## 2015-09-20 NOTE — Assessment & Plan Note (Addendum)
The most common causes of chronic cough in immunocompetent adults include the following: upper airway cough syndrome (UACS), previously referred to as postnasal drip syndrome (PNDS), which is caused by variety of rhinosinus conditions; (2) asthma; (3) GERD; (4) chronic bronchitis from cigarette smoking or other inhaled environmental irritants; (5) nonasthmatic eosinophilic bronchitis; and (6) bronchiectasis.   These conditions, singly or in combination, have accounted for up to 94% of the causes of chronic cough in prospective studies.   Other conditions have constituted no >6% of the causes in prospective studies These have included bronchogenic carcinoma, chronic interstitial pneumonia, sarcoidosis, left ventricular failure, ACEI-induced cough, and aspiration from a condition associated with pharyngeal dysfunction.    Chronic cough is often simultaneously caused by more than one condition. A single cause has been found from 38 to 82% of the time, multiple causes from 18 to 62%. Multiply caused cough has been the result of three diseases up to 42% of the time.       Based on hx and exam, this is most likely:  Cough variant asthma plus component of  Upper airway cough syndrome, so named because it's frequently impossible to sort out how much is  CR/sinusitis with freq throat clearing (which can be related to primary GERD)   vs  causing  secondary (" extra esophageal")  GERD from wide swings in gastric pressure that occur with throat clearing, often  promoting self use of mint and menthol lozenges that reduce the lower esophageal sphincter tone and exacerbate the problem further in a cyclical fashion.   These are the same pts (now being labeled as having "irritable larynx syndrome" by some cough centers) who not infrequently have a history of having failed to tolerate ace inhibitors,  dry powder inhalers or biphosphonates or report having atypical reflux symptoms that don't respond to standard doses of  PPI , and are easily confused as having aecopd or asthma flares by even experienced allergists/ pulmonologists.   The first step is to maximize GERD rx 24/7 and eliminate cyclical coughing while treating with the mildest of ICS = dulera 100 2bid   - The proper method of use, as well as anticipated side effects, of a metered-dose inhaler are discussed and demonstrated to the patient. Improved effectiveness after extensive coaching during this visit to a level of approximately 50 % from a baseline of 75 %   I had an extended discussion with the patient reviewing all relevant studies completed to date and  lasting 35/60 min  Each maintenance medication was reviewed in detail including most importantly the difference between maintenance and as needed and under what circumstances the prns are to be used.  Please see instructions for details which were reviewed in writing and the patient given a copy.

## 2015-09-20 NOTE — Progress Notes (Signed)
Subjective:    Patient ID: Algernon Huxleyngela R Majewski, female    DOB: 09/28/1970,   MRN: [id number]  HPI  [age] yowf never smoker with year round "sensitivity" to smoke/cough/ sneezing/wheezing as long as she can remember given saba age 45 but didn't help and got worse around age 45 then inhaler saba avg qod occ prednisone but bad psych side effects and then [place]  bad cough p perfume on plane cough to point of vomiting then much worse in early jan 2017 with fever /yellow mucus > R pna > 5 rounds of abx and rx with advair 09/18/15 with persistent sensation of chest tightness so referred to pulmonary clinic 09/20/2015 by Dr Norberto SorensonEva Shaw.   09/20/2015 1st [facility] office visit/ Wert   Chief Complaint  Patient presents with  . Advice Only    Referred by Dr. Clelia CroftShaw for b/l pna.    persistent daily cough> sob  x Oct 2016 worse before supper / better at hs p delsym/tussionex mucus is white / does better propped up   No better on alb hfa but def nebulizer helps / some assoc pnds / throat tickle   No obvious other patterns in day to day or daytime variabilty or assoc classically pleuritic or ex   cp   subjective wheeze overt sinus or hb symptoms. No unusual exp hx or h/o childhood pna/ asthma or knowledge of premature birth.  Sleeping ok without nocturnal  or early am exacerbation  of respiratory  c/o's or need for noct saba. Also denies any obvious fluctuation of symptoms with weather or environmental changes or other aggravating or alleviating factors except as outlined above   Current Medications, Allergies, Complete Past Medical History, Past Surgical History, Family History, and Social History were reviewed in [facility] electronic medical record.              Review of Systems  Constitutional: Positive for fatigue. Negative for fever and unexpected weight change.  HENT: Positive for voice change. Negative for congestion, dental problem, ear pain, nosebleeds, postnasal  drip, rhinorrhea, sinus pressure, sneezing, sore throat and trouble swallowing.   Eyes: Negative for redness and itching.  Respiratory: Positive for chest tightness and shortness of breath. Negative for cough and wheezing.   Cardiovascular: Negative for palpitations and leg swelling.  Gastrointestinal: Negative for nausea and vomiting.  Genitourinary: Negative for dysuria.  Musculoskeletal: Negative for joint swelling.  Skin: Negative for rash.  Neurological: Negative for headaches.  Hematological: Does not bruise/bleed easily.  Psychiatric/Behavioral: Negative for dysphoric mood. The patient is not nervous/anxious.        Objective:   Physical Exam  amb wf nad   Wt Readings from Last 3 Encounters:  09/20/15 244 lb 9.6 oz (110.95 kg)  09/18/15 243 lb (110.224 kg)  09/03/15 246 lb 12.8 oz (111.948 kg)    Vital signs reviewed     HEENT: nl dentition, turbinates, and oropharynx. Nl external ear canals without cough reflex   NECK :  without JVD/Nodes/TM/ nl carotid upstrokes bilaterally   LUNGS: no acc muscle use,  Nl contour chest which is clear to A and P bilaterally without cough on insp or exp maneuvers   CV:  RRR  no s3 or murmur or increase in P2, no edema   ABD:  soft and nontender with nl inspiratory excursion in the supine position. No bruits or organomegaly, bowel sounds nl  MS:  Nl gait/ ext warm without deformities,  calf tenderness, cyanosis or clubbing No obvious joint restrictions   SKIN: warm and dry without lesions    NEURO:  alert, approp, nl sensorium with  no motor deficits     I personally reviewed images and agree with radiology impression as follows:  CXR:  09/18/15 Persistent linear density in the right mid lung compatible with subsegmental atelectasis, stable. Left lung is clear. Heart is normal size. No effusions. No acute bony abnormality.      Assessment & Plan:

## 2015-09-23 ENCOUNTER — Telehealth: Payer: Self-pay | Admitting: *Deleted

## 2015-09-23 NOTE — Telephone Encounter (Signed)
They all can do this but the first question is whether any of her symptoms are better (the cough) if so or not really sure yet best option is just use the dulera one twice daily for a few days and if still having symptoms stop it completely

## 2015-09-23 NOTE — Telephone Encounter (Signed)
I spoke with the pt and notified ct chest can be cancelled  I have cancelled this in Epic   She [facility] me that [facility] and it caused "heart to feel like it was going to run down the street" She stopped taking after 2 days and has not had any more palpitations  She is wondering if there is another inhaler she should try   Below are her AVS instructions:  Plan A = Automatic = Dulera 100 Take 2 puffs first thing in am and then another 2 puffs about 12 hours later  Prilosec 40 mg Take 30- 60 min before your first and last meals of the day   Delsym 2 tsp every 12 hours    Plan B = Backup For breathing > ok to use the nebulizer up to every 4 hours if you can't catch your breath  For cough > Tessalon up to every 4 hours and if not better then tussionex  For drainage / throat tickle try take CHLORPHENIRAMINE 4 mg - take one every 4 hours as needed - available over the counter- may cause drowsiness so start with just a bedtime dose or two and see how you tolerate it before trying in daytime   Stop advair and mucinex  GERD (REFLUX) is an extremely common cause of respiratory symptoms just like yours , many times with no obvious heartburn at all.   It can be treated with medication, but also with lifestyle changes including elevation of the head of your bed (ideally with 6 inch bed blocks), Smoking cessation, avoidance of late meals, excessive alcohol, and avoid fatty foods, chocolate, peppermint, colas, red wine, and acidic juices such as orange juice.  NO MINT OR MENTHOL PRODUCTS SO NO COUGH DROPS  USE SUGARLESS CANDY INSTEAD (Jolley ranchers or Stover's or Life Savers) or even ice chips will also do - the key is to swallow to prevent all throat clearing. NO OIL BASED VITAMINS - use powdered substitutes.   Please schedule a follow up office visit in 2 weeks, sooner if needed

## 2015-09-23 NOTE — Telephone Encounter (Signed)
Spoke with pt, states she will try half dosage and will keep us updated with how it works for her.  Nothing further needed at this time.

## 2015-09-23 NOTE — Telephone Encounter (Signed)
-----   Message from Nyoka CowdenMichael B Wert, MD sent at 09/20/2015  5:43 PM EST ----- Tell her to cancel ct chest for now that was scheduled 10/03/15 as we may do it in future with a sinus scan but only if still coughing

## 2015-10-03 ENCOUNTER — Other Ambulatory Visit: Payer: Self-pay

## 2015-10-07 ENCOUNTER — Ambulatory Visit: Payer: Self-pay | Admitting: Pulmonary Disease

## 2015-10-28 DIAGNOSIS — R0602 Shortness of breath: Secondary | ICD-10-CM | POA: Diagnosis not present

## 2015-10-28 DIAGNOSIS — J9801 Acute bronchospasm: Secondary | ICD-10-CM | POA: Diagnosis not present

## 2015-10-28 DIAGNOSIS — J309 Allergic rhinitis, unspecified: Secondary | ICD-10-CM | POA: Diagnosis not present

## 2015-10-29 ENCOUNTER — Ambulatory Visit (INDEPENDENT_AMBULATORY_CARE_PROVIDER_SITE_OTHER): Payer: BLUE CROSS/BLUE SHIELD | Admitting: Physician Assistant

## 2015-10-29 ENCOUNTER — Encounter: Payer: Self-pay | Admitting: Physician Assistant

## 2015-10-29 VITALS — BP 110/90 | HR 98 | Temp 98.1°F | Resp 16 | Ht 63.5 in | Wt 246.2 lb

## 2015-10-29 DIAGNOSIS — J45991 Cough variant asthma: Secondary | ICD-10-CM

## 2015-10-29 DIAGNOSIS — K8020 Calculus of gallbladder without cholecystitis without obstruction: Secondary | ICD-10-CM | POA: Diagnosis not present

## 2015-10-29 DIAGNOSIS — J189 Pneumonia, unspecified organism: Secondary | ICD-10-CM | POA: Diagnosis not present

## 2015-10-29 DIAGNOSIS — J3089 Other allergic rhinitis: Secondary | ICD-10-CM | POA: Diagnosis not present

## 2015-10-29 NOTE — Patient Instructions (Signed)
     IF you received an x-ray today, you will receive an invoice from [facility]. Please contact [facility] at [phone] with questions or concerns regarding your invoice.   IF you received labwork today, you will receive an invoice from [facility]. Please contact [facility] at [phone] with questions or concerns regarding your invoice.   Our billing staff will not be able to assist you with questions regarding bills from these companies.  You will be contacted with the lab results as soon as they are available. The fastest way to get your results is to activate your My Chart account. Instructions are located on the last page of this paperwork. If you have not heard from us regarding the results in 2 weeks, please contact this office.      

## 2015-10-29 NOTE — Progress Notes (Signed)
Patient ID: Nancy Franklin, female    DOB: 11/30/1970, 45 y.o.   MRN: [id number]  PCP: Olene FlossJEFFERY,Jaymes Hang, PA-C  Subjective:   Chief Complaint  Patient presents with  . Follow-up    from referrals, per pt "trying to make sense of iit"  . pt unsure as to whether she should stay on meds  . pneumonia    follow up    HPI Presents for evaluation of cough, following extended illness and treatment and detailed evaluation by multiple specialists.  Still hasn't recovered the stamina she had previously, but continues to improve. Not coughing. Post-nasal drainage is resolved.  Really not happy with the specialists she's seen recently. Getting different recommendations from different specialists, and feeling not listened to, blown-off, discounted, belittled.  Allergy testing revealed allergy to grasses, two types of mold, and a couple of trees. She is increasingly more sensitive to cigarette smoke, but it is very difficult to avoid. She drives a convertible vehicle.  During the evaluation, she was referred to GI (for "explosive stomach") and had a GB US. The report she received yesterday shows gallstones, and she's anticipating a recommendation to have a cholecystectomy. She would like to delay that if possible. She really thinks that her symptoms were related to the stress of care giving for her mother until her recent death. She states that she has symptoms only when she is feeling stressed (like talking with her father).  In addition, she was referred to rheumatology for evaluation of her hand/wrist pain. She was relieved to find that she does NOT have RA.  Wants to minimize the medications, only taking what she absolutely needs. She has already stopped using many of the medications prescribed by pulmonology and GI. Wants to resume the essential oils and herbs that have worked for her in the past. Especially peppermint oil which helps her allergies, even though she was advised against it, as a  possible trigger/exacerbater of reflux.    Review of Systems  Constitutional: Positive for fatigue. Negative for fever, chills and diaphoresis.  HENT: Negative for congestion, postnasal drip, rhinorrhea, sinus pressure and sore throat.   Eyes: Negative for pain and itching.  Respiratory: Negative for cough, chest tightness, shortness of breath and wheezing.   Cardiovascular: Negative for chest pain, palpitations and leg swelling.  Gastrointestinal: Positive for abdominal pain (only with stress).  Musculoskeletal: Positive for arthralgias (hands/wrists).  Allergic/Immunologic: Positive for environmental allergies.  Neurological: Negative for dizziness, weakness and headaches.  Hematological: Negative for adenopathy. Does not bruise/bleed easily.  Psychiatric/Behavioral: Negative for behavioral problems, confusion, sleep disturbance, self-injury and dysphoric mood. The patient is not nervous/anxious.        Patient Active Problem List   Diagnosis Date Noted  . Cough variant asthma with component of UACS 09/20/2015  . CAP (community acquired pneumonia) 09/20/2015  . Environmental and seasonal allergies 03/12/2015  . HYPERLIPIDEMIA 09/23/2007  . OBESITY, UNSPECIFIED 09/23/2007  . ENDOMETRIOSIS, SITE UNSPECIFIED 09/23/2007  . OVARIAN CYST 09/23/2007     Prior to Admission medications   Medication Sig Start Date End Date Taking? Authorizing Provider  ADVAIR DISKUS 250-50 MCG/DOSE AEPB  10/28/15  Yes Historical Provider, MD  fluticasone (FLONASE) 50 MCG/ACT nasal spray Place 2 sprays into both nostrils at bedtime. 09/03/15  Yes Sherren MochaEva N Shaw, MD  loratadine-pseudoephedrine (CLARITIN-D 24-HOUR) 10-240 MG per 24 hr tablet Take 1 tablet by mouth daily. 03/12/15  Yes Porfirio Oarhelle Oyindamola Key, PA-C  norethindrone-ethinyl estradiol 1/35 (ORTHO-NOVUM, NORTREL,CYCLAFEM) tablet Take 1 tablet by mouth daily.  take continuously, skip non-hormone containing tablets. 03/12/15  Yes Jancy Sprankle, PA-C  albuterol  (PROVENTIL) (2.5 MG/3ML) 0.083% nebulizer solution Take 3 mLs (2.5 mg total) by nebulization every 6 (six) hours as needed for wheezing or shortness of breath. 08/07/15   Sherren Mocha, MD  azelastine (ASTELIN) 0.1 % nasal spray Place 1 spray into both nostrils 2 (two) times daily. Use in each nostril as directed Patient not taking: Reported on 10/29/2015 09/03/15   Sherren Mocha, MD  benzonatate (TESSALON) 200 MG capsule Take 1 capsule (200 mg total) by mouth 3 (three) times daily as needed for cough. Patient not taking: Reported on 10/29/2015 08/04/15   Sherren Mocha, MD  ipratropium (ATROVENT) 0.03 % nasal spray Reported on 10/29/2015 08/21/15   Historical Provider, MD  meloxicam (MOBIC) 15 MG tablet Take 1 tablet (15 mg total) by mouth daily. Patient not taking: Reported on 10/29/2015 09/18/15   Sherren Mocha, MD  mometasone-formoterol [facility]) 100-5 MCG/ACT AERO Take 2 puffs first thing in am and then another 2 puffs about 12 hours later. Patient not taking: Reported on 10/29/2015 09/20/15   Nyoka Cowden, MD  montelukast (SINGULAIR) 10 MG tablet Reported on 10/29/2015 09/03/15   Historical Provider, MD  omeprazole (PRILOSEC) 40 MG capsule Take 30- 60 min before your first and last meals of the day Patient not taking: Reported on 10/29/2015 09/20/15   Nyoka Cowden, MD     Allergies  Allergen Reactions  . Latex   . Prednisone Other (See Comments)    Light headness, dizzy  . Pine Rash and Other (See Comments)    Respiratory and eye symptoms       Objective:  Physical Exam  Constitutional: She is oriented to person, place, and time. She appears well-developed and well-nourished. She is active and cooperative. No distress.  BP 110/90 mmHg  Pulse 98  Temp(Src) 98.1 F (36.7 C) (Oral)  Resp 16  Ht 5' 3.5" (1.613 m)  Wt 246 lb 3.2 oz (111.676 kg)  BMI 42.92 kg/m2  SpO2 98%  LMP 09/21/2015  HENT:  Head: Normocephalic and atraumatic.  Right Ear: Hearing normal.  Left Ear: Hearing normal.  Eyes:  Conjunctivae are normal. No scleral icterus.  Neck: Normal range of motion. Neck supple. No thyromegaly present.  Cardiovascular: Normal rate, regular rhythm and normal heart sounds.   Pulses:      Radial pulses are 2+ on the right side, and 2+ on the left side.  Pulmonary/Chest: Effort normal and breath sounds normal.  Lymphadenopathy:       Head (right side): No tonsillar, no preauricular, no posterior auricular and no occipital adenopathy present.       Head (left side): No tonsillar, no preauricular, no posterior auricular and no occipital adenopathy present.    She has no cervical adenopathy.       Right: No supraclavicular adenopathy present.       Left: No supraclavicular adenopathy present.  Neurological: She is alert and oriented to person, place, and time. No sensory deficit.  Skin: Skin is warm, dry and intact. No rash noted. No cyanosis or erythema. Nails show no clubbing.  Psychiatric: She has a normal mood and affect. Her speech is normal and behavior is normal.           Assessment & Plan:   1. CAP (community acquired pneumonia) Resolved.  2. Cough variant asthma with component of UACS She disagrees with this diagnosis, and believes that the infection triggered  the cough, which is now resolved.   3. Environmental and seasonal allergies She has no plan to stop driving her convertible, and prefers to use essential oils and other complementary medicine techniques to manage this.  4. Calculus of gallbladder without cholecystitis without obstruction Asymptomatic (her episodic abdominal pain occurs only with stress and resolves with relaxation techniques). If she notes increased symptoms, or if symptoms become triggered by eating, would reconsider surgical evaluation.  I am supportive of her desire to minimize prescription medication at this time. If she develops recurrent symptoms, would revisit them, but for now, stop.   Medications Discontinued During This Encounter   Medication Reason  . omeprazole (PRILOSEC) 40 MG capsule   . mometasone-formoterol (DULERA) 100-5 MCG/ACT AERO   . meloxicam (MOBIC) 15 MG tablet   . montelukast (SINGULAIR) 10 MG tablet   . ipratropium (ATROVENT) 0.03 % nasal spray   . azelastine (ASTELIN) 0.1 % nasal spray   . albuterol (PROVENTIL) (2.5 MG/3ML) 0.083% nebulizer solution   . ADVAIR DISKUS 250-50 MCG/DOSE AEPB      Fernande Bras, PA-C Physician Assistant-Certified [facility]

## 2015-12-10 MED FILL — DASETTA 1-35-28 TABLET: 1-35 | 84 days supply | Qty: 112 | Fill #3

## 2015-12-10 MED FILL — ALLERGY RELIEF-NASAL DECONG: 10-240 | 90 days supply | Qty: 90 | Fill #2

## 2016-03-02 MED FILL — DASETTA 1-35-28 TABLET: 1-35 | 84 days supply | Qty: 112 | Fill #4

## 2016-03-02 MED FILL — ALLERGY RELIEF-NASAL DECONG: 10-240 | 90 days supply | Qty: 90 | Fill #3

## 2016-03-10 ENCOUNTER — Encounter: Payer: Self-pay | Admitting: Physician Assistant

## 2016-03-10 ENCOUNTER — Ambulatory Visit (INDEPENDENT_AMBULATORY_CARE_PROVIDER_SITE_OTHER): Payer: BLUE CROSS/BLUE SHIELD | Admitting: Physician Assistant

## 2016-03-10 VITALS — BP 130/82 | HR 94 | Temp 97.6°F | Resp 17 | Ht 63.5 in | Wt 246.0 lb

## 2016-03-10 DIAGNOSIS — J0100 Acute maxillary sinusitis, unspecified: Secondary | ICD-10-CM | POA: Diagnosis not present

## 2016-03-10 DIAGNOSIS — R05 Cough: Secondary | ICD-10-CM | POA: Diagnosis not present

## 2016-03-10 DIAGNOSIS — R059 Cough, unspecified: Secondary | ICD-10-CM

## 2016-03-10 MED ORDER — BENZONATATE 200 MG PO CAPS: 200.0000 mg | ORAL_CAPSULE | Freq: Three times a day (TID) | ORAL | 0 refills | Status: DC | PRN

## 2016-03-10 MED ORDER — AMOXICILLIN-POT CLAVULANATE 875-125 MG PO TABS: 1.0000 | ORAL_TABLET | Freq: Two times a day (BID) | ORAL | 0 refills | Status: AC

## 2016-03-10 MED ORDER — HYDROCOD POLST-CPM POLST ER 10-8 MG/5ML PO SUER: 5.0000 mL | Freq: Two times a day (BID) | ORAL | 0 refills | Status: DC | PRN

## 2016-03-10 MED FILL — AMOX-CLAV 875-125 MG TABLET: 875-125 | 10 days supply | Qty: 20 | Fill #0

## 2016-03-10 MED FILL — BENZONATATE 200 MG CAPSULE: 200 | 13 days supply | Qty: 40 | Fill #0

## 2016-03-10 MED FILL — HYDROCODONE-CHLORPHENIRAM S: 10-8 | 10 days supply | Qty: 100 | Fill #0

## 2016-03-10 NOTE — Patient Instructions (Signed)
     IF you received an x-ray today, you will receive an invoice from [facility]. Please contact [facility] at [phone] with questions or concerns regarding your invoice.   IF you received labwork today, you will receive an invoice from [facility]. Please contact [facility] at [phone] with questions or concerns regarding your invoice.   Our billing staff will not be able to assist you with questions regarding bills from these companies.  You will be contacted with the lab results as soon as they are available. The fastest way to get your results is to activate your My Chart account. Instructions are located on the last page of this paperwork. If you have not heard from us regarding the results in 2 weeks, please contact this office.      

## 2016-03-10 NOTE — Progress Notes (Signed)
Patient ID: Nancy Franklin, female    DOB: 07/13/1971, 45 y.o.   MRN: [id number]  PCP: Porfirio Oarhelle Vonya Ohalloran, PA-C  Subjective:   Chief Complaint  Patient presents with  . Cough    Productive. Since Friday.     HPI Presents for evaluation of allergies.  "I am a phlegmy mess." New carpet cleaning product initially aggravated her symptoms about a week ago. Then drove to [place] and symptoms became severe.  Nasal drainage is clear. Cough is producing yellow sputum. Keeps her awake at night. Ear fullness/pressure. Feels like the prodrome to previous pneumonia. No fever/chills. HA. Facial pain/pressure. Dental pain. Vision is fuzzy. Eye exam was normal-advised it's her allergies.  Symptoms improved some a little this morning when [place] [facility] [place]  While in [place], she had a consultation for Lasik surgery. The clinician at the office advised her that the mold spores were at high levels there at the time, and she knows she is allergic to mold spores from previous allergy testing.  Has scheduled with a new allergist, Thedore Minsosalind Hicks, MD, 03/26/2016. Has been previously told that she should invest in a big box of tissues, and prescribed an inhaler without treatment of her allergy symptoms. Montelukast was ineffective.    Review of Systems As above    Patient Active Problem List   Diagnosis Date Noted  . Cholelithiasis 10/29/2015  . Cough variant asthma with component of UACS 09/20/2015  . Environmental and seasonal allergies 03/12/2015  . HYPERLIPIDEMIA 09/23/2007  . OBESITY, UNSPECIFIED 09/23/2007  . ENDOMETRIOSIS, SITE UNSPECIFIED 09/23/2007  . OVARIAN CYST 09/23/2007     Prior to Admission medications   Medication Sig Start Date End Date Taking? Authorizing Provider  benzonatate (TESSALON) 200 MG capsule Take 1 capsule (200 mg total) by mouth 3 (three) times daily as needed for cough. 08/04/15  Yes Sherren MochaEva N Shaw, MD  fluticasone (FLONASE) 50 MCG/ACT nasal spray  Place 2 sprays into both nostrils at bedtime. 09/03/15  Yes Sherren MochaEva N Shaw, MD  loratadine-pseudoephedrine (CLARITIN-D 24-HOUR) 10-240 MG per 24 hr tablet Take 1 tablet by mouth daily. 03/12/15  Yes Porfirio Oarhelle Shantoria Ellwood, PA-C  norethindrone-ethinyl estradiol 1/35 (ORTHO-NOVUM, NORTREL,CYCLAFEM) tablet Take 1 tablet by mouth daily. take continuously, skip non-hormone containing tablets. 03/12/15  Yes Porfirio Oarhelle Nicholad Kautzman, PA-C     Allergies  Allergen Reactions  . Latex   . Prednisone Other (See Comments)    Light headness, dizzy  . Pine Rash and Other (See Comments)    Respiratory and eye symptoms       Objective:  Physical Exam  Constitutional: She is oriented to person, place, and time. She appears well-developed and well-nourished. No distress.  BP 130/82 (BP Location: Right Arm, Patient Position: Sitting, Cuff Size: Large)   Pulse 94   Temp 97.6 F (36.4 C) (Oral)   Resp 17   Ht 5' 3.5" (1.613 m)   Wt 246 lb (111.6 kg)   LMP 03/03/2016 (Approximate)   SpO2 98%   BMI 42.89 kg/m    HENT:  Head: Normocephalic and atraumatic.  Right Ear: Hearing, tympanic membrane, external ear and ear canal normal.  Left Ear: Hearing, tympanic membrane, external ear and ear canal normal.  Nose: Mucosal edema and rhinorrhea present.  No foreign bodies. Right sinus exhibits no maxillary sinus tenderness and no frontal sinus tenderness. Left sinus exhibits no maxillary sinus tenderness and no frontal sinus tenderness.  Mouth/Throat: Uvula is midline, oropharynx is clear and moist and mucous membranes are normal. No uvula  swelling. No oropharyngeal exudate.  Eyes: Conjunctivae and EOM are normal. Pupils are equal, round, and reactive to light. Right eye exhibits no discharge. Left eye exhibits no discharge. No scleral icterus.  Neck: Trachea normal, normal range of motion and full passive range of motion without pain. Neck supple. No thyroid mass and no thyromegaly present.  Cardiovascular: Normal rate, regular  rhythm and normal heart sounds.   Pulmonary/Chest: Effort normal and breath sounds normal.  Lymphadenopathy:       Head (right side): No submandibular, no tonsillar, no preauricular, no posterior auricular and no occipital adenopathy present.       Head (left side): No submandibular, no tonsillar, no preauricular and no occipital adenopathy present.    She has no cervical adenopathy.       Right: No supraclavicular adenopathy present.       Left: No supraclavicular adenopathy present.  Neurological: She is alert and oriented to person, place, and time. She has normal strength. No cranial nerve deficit or sensory deficit.  Skin: Skin is warm, dry and intact. No rash noted.  Psychiatric: She has a normal mood and affect. Her speech is normal and behavior is normal.           Assessment & Plan:   1. Subacute maxillary sinusitis Given her previous prolonged, complicated and difficult to treat course, elect to treat for bacterial etiology. Would use prednisone, but she has a history of adverse reaction, though not allergy, to it. Rest. Hydrate. Proceed to allergy evaluation as planned. - amoxicillin-clavulanate (AUGMENTIN) 875-125 MG tablet; Take 1 tablet by mouth 2 (two) times daily.  Dispense: 20 tablet; Refill: 0  2. Cough No wheezing. Likely due to post-nasal drainage. Supportive care.  - benzonatate (TESSALON) 200 MG capsule; Take 1 capsule (200 mg total) by mouth 3 (three) times daily as needed for cough.  Dispense: 40 capsule; Refill: 0 - chlorpheniramine-HYDROcodone (TUSSIONEX PENNKINETIC ER) 10-8 MG/5ML SUER; Take 5 mLs by mouth every 12 (twelve) hours as needed for cough.  Dispense: 100 mL; Refill: 0   Fernande Brashelle S. Aquarius Latouche, PA-C Physician Assistant-Certified [facility]

## 2016-03-13 ENCOUNTER — Telehealth: Payer: Self-pay

## 2016-03-13 ENCOUNTER — Telehealth: Payer: Self-pay | Admitting: Family Medicine

## 2016-03-13 MED ORDER — PREDNISONE 20 MG PO TABS: ORAL_TABLET | ORAL | 0 refills | Status: DC

## 2016-03-13 MED ORDER — BECLOMETHASONE DIPROPIONATE 80 MCG/ACT IN AERS: 2.0000 | INHALATION_SPRAY | Freq: Two times a day (BID) | RESPIRATORY_TRACT | 0 refills | Status: DC

## 2016-03-13 MED FILL — QVAR 80 MCG ORAL INHALER: 80 | 30 days supply | Qty: 9 | Fill #0

## 2016-03-13 MED FILL — predniSONE 20 MG TABS: 20 | 9 days supply | Qty: 18 | Fill #0

## 2016-03-13 NOTE — Telephone Encounter (Signed)
R sent. Pt notified.

## 2016-03-13 NOTE — Telephone Encounter (Signed)
Given how severe her symptoms were the last time this happened, I would recommend that we add an oral course of steroids.  However, it's listed as an intolerance, causing lightheadedness and dizziness.  We could try an inhaled steroid: Qvar 80 mcg, 2 puffs BID. #1, no refills.  If that doesn't work, then RTC.

## 2016-03-13 NOTE — Telephone Encounter (Signed)
Pt calling stating that she isn't feeling any better she's coughing more due to drainage of course and she's running  A fever taking tylenol it helps a little  still taking the antibiotic cough syrup and tessalon pills its not helping but she was just here on the 22nd of this month she wants to know if she needs anything else or should she just hold off for a few days on getting something else please respond to patient at [phone]

## 2016-03-13 NOTE — Telephone Encounter (Signed)
Meds ordered this encounter  Medications  . predniSONE (DELTASONE) 20 MG tablet    Sig: Take 3 PO QAM x3days, 2 PO QAM x3days, 1 PO QAM x3days    Dispense:  18 tablet    Refill:  0    Order Specific Question:   Supervising Provider    Answer:   SHAW, EVA N [[id number]]     

## 2016-03-13 NOTE — Telephone Encounter (Signed)
Pt states she can actually take prednisone because she will be at home for the next couple of days. She states she cannot take it and work, that's the only problem. She did mention her mucus is thick and yellow now. Please advise.

## 2016-03-19 DIAGNOSIS — J3081 Allergic rhinitis due to animal (cat) (dog) hair and dander: Secondary | ICD-10-CM | POA: Diagnosis not present

## 2016-03-19 DIAGNOSIS — R062 Wheezing: Secondary | ICD-10-CM | POA: Diagnosis not present

## 2016-03-19 DIAGNOSIS — J4540 Moderate persistent asthma, uncomplicated: Secondary | ICD-10-CM | POA: Diagnosis not present

## 2016-03-19 DIAGNOSIS — J3089 Other allergic rhinitis: Secondary | ICD-10-CM | POA: Diagnosis not present

## 2016-03-19 DIAGNOSIS — J301 Allergic rhinitis due to pollen: Secondary | ICD-10-CM | POA: Diagnosis not present

## 2016-03-19 MED FILL — LEVOCETIRIZINE 5 MG TABLET: 5 | 30 days supply | Qty: 30 | Fill #0

## 2016-03-19 MED FILL — SPIRIVA RESPIMAT 1.25 MCG I: 1.25 | 30 days supply | Qty: 4 | Fill #0

## 2016-03-26 ENCOUNTER — Ambulatory Visit: Payer: BLUE CROSS/BLUE SHIELD | Admitting: Allergy & Immunology

## 2016-04-13 ENCOUNTER — Ambulatory Visit (INDEPENDENT_AMBULATORY_CARE_PROVIDER_SITE_OTHER): Payer: BLUE CROSS/BLUE SHIELD | Admitting: Physician Assistant

## 2016-04-13 VITALS — BP 118/76 | HR 102 | Temp 98.2°F | Resp 18 | Ht 63.5 in | Wt 245.0 lb

## 2016-04-13 DIAGNOSIS — Z23 Encounter for immunization: Secondary | ICD-10-CM

## 2016-04-13 DIAGNOSIS — N809 Endometriosis, unspecified: Secondary | ICD-10-CM

## 2016-04-13 DIAGNOSIS — J3089 Other allergic rhinitis: Secondary | ICD-10-CM

## 2016-04-13 DIAGNOSIS — J45991 Cough variant asthma: Secondary | ICD-10-CM | POA: Diagnosis not present

## 2016-04-13 MED ORDER — NORETHINDRONE-ETH ESTRADIOL 1-35 MG-MCG PO TABS: 1.0000 | ORAL_TABLET | Freq: Every day | ORAL | 4 refills | Status: DC

## 2016-04-13 MED FILL — SPIRIVA RESPIMAT 1.25 MCG I: 1.25 | 30 days supply | Qty: 4 | Fill #1

## 2016-04-13 NOTE — Patient Instructions (Addendum)
   IF you received an x-ray today, you will receive an invoice from [facility]. Please contact  Radiology at [phone] with questions or concerns regarding your invoice.   IF you received labwork today, you will receive an invoice from [facility]. Please contact [facility] at [phone] with questions or concerns regarding your invoice.   Our billing staff will not be able to assist you with questions regarding bills from these companies.  You will be contacted with the lab results as soon as they are available. The fastest way to get your results is to activate your My Chart account. Instructions are located on the last page of this paperwork. If you have not heard from us regarding the results in 2 weeks, please contact this office.    Influenza (Flu) Vaccine (Inactivated or Recombinant):  1. Why get vaccinated? Influenza ("flu") is a contagious disease that spreads around the [place] every year, usually between October and May. Flu is caused by influenza viruses, and is spread mainly by coughing, sneezing, and close contact. Anyone can get flu. Flu strikes suddenly and can last several days. Symptoms vary by age, but can include:  fever/chills  sore throat  muscle aches  fatigue  cough  headache  runny or stuffy nose Flu can also lead to pneumonia and blood infections, and cause diarrhea and seizures in children. If you have a medical condition, such as heart or lung disease, flu can make it worse. Flu is more dangerous for some people. Infants and young children, people 65 years of age and older, pregnant women, and people with certain health conditions or a weakened immune system are at greatest risk. Each year thousands of people in the [place] die from flu, and many more are hospitalized. Flu vaccine can:  keep you from getting flu,  make flu less severe if you do get it, and  keep you from spreading flu to  your family and other people. 2. Inactivated and recombinant flu vaccines A dose of flu vaccine is recommended every flu season. Children 6 months through 8 years of age may need two doses during the same flu season. Everyone else needs only one dose each flu season. Some inactivated flu vaccines contain a very small amount of a mercury-based preservative called thimerosal. Studies have not shown thimerosal in vaccines to be harmful, but flu vaccines that do not contain thimerosal are available. There is no live flu virus in flu shots. They cannot cause the flu. There are many flu viruses, and they are always changing. Each year a new flu vaccine is made to protect against three or four viruses that are likely to cause disease in the upcoming flu season. But even when the vaccine doesn't exactly match these viruses, it may still provide some protection. Flu vaccine cannot prevent:  flu that is caused by a virus not covered by the vaccine, or  illnesses that look like flu but are not. It takes about 2 weeks for protection to develop after vaccination, and protection lasts through the flu season. 3. Some people should not get this vaccine Tell the person who is giving you the vaccine:  If you have any severe, life-threatening allergies. If you ever had a life-threatening allergic reaction after a dose of flu vaccine, or have a severe allergy to any part of this vaccine, you may be advised not to get vaccinated. Most, but not all, types of flu vaccine contain a small amount of egg protein.    If you ever had Guillain-Barre Syndrome (also called GBS). Some people with a history of GBS should not get this vaccine. This should be discussed with your doctor.  If you are not feeling well. It is usually okay to get flu vaccine when you have a mild illness, but you might be asked to come back when you feel better. 4. Risks of a vaccine reaction With any medicine, including vaccines, there is a chance of  reactions. These are usually mild and go away on their own, but serious reactions are also possible. Most people who get a flu shot do not have any problems with it. Minor problems following a flu shot include:  soreness, redness, or swelling where the shot was given  hoarseness  sore, red or itchy eyes  cough  fever  aches  headache  itching  fatigue If these problems occur, they usually begin soon after the shot and last 1 or 2 days. More serious problems following a flu shot can include the following:  There may be a small increased risk of Guillain-Barre Syndrome (GBS) after inactivated flu vaccine. This risk has been estimated at 1 or 2 additional cases per million people vaccinated. This is much lower than the risk of severe complications from flu, which can be prevented by flu vaccine.  Young children who get the flu shot along with pneumococcal vaccine (PCV13) and/or DTaP vaccine at the same time might be slightly more likely to have a seizure caused by fever. Ask your doctor for more information. Tell your doctor if a child who is getting flu vaccine has ever had a seizure. Problems that could happen after any injected vaccine:  People sometimes faint after a medical procedure, including vaccination. Sitting or lying down for about 15 minutes can help prevent fainting, and injuries caused by a fall. Tell your doctor if you feel dizzy, or have vision changes or ringing in the ears.  Some people get severe pain in the shoulder and have difficulty moving the arm where a shot was given. This happens very rarely.  Any medication can cause a severe allergic reaction. Such reactions from a vaccine are very rare, estimated at about 1 in a million doses, and would happen within a few minutes to a few hours after the vaccination. As with any medicine, there is a very remote chance of a vaccine causing a serious injury or death. The safety of vaccines is always being monitored. For  more information, visit: www.cdc.gov/vaccinesafety/ 5. What if there is a serious reaction? What should I look for?  Look for anything that concerns you, such as signs of a severe allergic reaction, very high fever, or unusual behavior. Signs of a severe allergic reaction can include hives, swelling of the face and throat, difficulty breathing, a fast heartbeat, dizziness, and weakness. These would start a few minutes to a few hours after the vaccination. What should I do?  If you think it is a severe allergic reaction or other emergency that can't wait, call 9-1-1 and get the person to the nearest hospital. Otherwise, call your doctor.  Reactions should be reported to the Vaccine Adverse Event Reporting System (VAERS). Your doctor should file this report, or you can do it yourself through the VAERS web site at www.vaers.hhs.gov, or by calling 1-800-822-7967. VAERS does not give medical advice. 6. The National Vaccine Injury Compensation Program The National Vaccine Injury Compensation Program (VICP) is a federal program that was created to compensate people who may have been   injured by certain vaccines. Persons who believe they may have been injured by a vaccine can learn about the program and about filing a claim by calling 1-800-338-2382 or visiting the VICP website at www.hrsa.gov/vaccinecompensation. There is a time limit to file a claim for compensation. 7. How can I learn more?  Ask your healthcare provider. He or she can give you the vaccine package insert or suggest other sources of information.  Call your local or state health department.  Contact the Centers for Disease Control and Prevention (CDC):  Call 1-800-232-4636 (1-800-CDC-INFO) or  Visit CDC's website at www.cdc.gov/flu Vaccine Information Statement Inactivated Influenza Vaccine (02/23/2014)   This information is not intended to replace advice given to you by your health care provider. Make sure you discuss any  questions you have with your health care provider.   Document Released: 04/30/2006 Document Revised: 07/27/2014 Document Reviewed: 02/26/2014 Elsevier Interactive Patient Education 2016 Elsevier Inc.  

## 2016-04-13 NOTE — Progress Notes (Signed)
Patient ID: Nancy Franklin, female    DOB: 03-Sep-1970, 45 y.o.   MRN: [id number]  PCP: Porfirio Oar, PA-C  Chief Complaint  Patient presents with  . Flu Vaccine    poss pneumonia shot  . Follow-up    last visit    Subjective:  HPI Presents for pneumococcal vaccine and to update me on her visit with Dr. Irena Cords.  She had repeated allergy testing, which surprisingly revealed that she is NOT allergic to pine, but rather mold. Immunotherapy has been recommended, but she needs to be fully healthy first.  She is developing a plan for reducing sick contacts as winter and flu season approach. She will decline to see clients who are ill. Their parents may wear a mask and wait in her lobby, or wait in the car. She will continue her maintenance medications and endeavor to avoid the known allergens.   Review of Systems All positive responses are qualified: they are intermittent and occur with allergy flares. Constitutional: Positive for fatigue. Negative for activity change, appetite change, chills and fever.  HENT: Positive for congestion, ear discharge, ear pain, sinus pressure and voice change. Negative for rhinorrhea.   Eyes: Negative for photophobia and visual disturbance.  Respiratory: Positive for cough, choking, chest tightness, shortness of breath and wheezing.   Cardiovascular: Negative for chest pain, palpitations and leg swelling.  Gastrointestinal: Negative for abdominal pain, diarrhea, nausea and vomiting.  Genitourinary: Negative for dysuria, frequency and urgency.  Musculoskeletal: Positive for myalgias. Negative for back pain.  Skin: Negative for rash.  Neurological: Positive for dizziness (Allergy related) and headaches (Allergy related). Negative for numbness.  Psychiatric/Behavioral: Negative for agitation and dysphoric mood.     Patient Active Problem List   Diagnosis Date Noted  . Cholelithiasis 10/29/2015  . Cough variant asthma with component of UACS  09/20/2015  . Environmental and seasonal allergies 03/12/2015  . HYPERLIPIDEMIA 09/23/2007  . OBESITY, UNSPECIFIED 09/23/2007  . ENDOMETRIOSIS, SITE UNSPECIFIED 09/23/2007  . OVARIAN CYST 09/23/2007    Allergies  Allergen Reactions  . Latex   . Prednisone Other (See Comments)    Light headness, dizzy  . Pine Rash and Other (See Comments)    Respiratory and eye symptoms    Prior to Admission medications   Medication Sig Start Date End Date Taking? Authorizing Provider  beclomethasone (QVAR) 80 MCG/ACT inhaler Inhale 2 puffs into the lungs 2 (two) times daily. 03/13/16  Yes Jonluke Cobbins, PA-C  benzonatate (TESSALON) 200 MG capsule Take 1 capsule (200 mg total) by mouth 3 (three) times daily as needed for cough. 03/10/16  Yes Jabin Tapp, PA-C  fluticasone (FLONASE) 50 MCG/ACT nasal spray Place 2 sprays into both nostrils at bedtime. 09/03/15  Yes Sherren Mocha, MD  levocetirizine (XYZAL) 5 MG tablet Take 5 mg by mouth every evening.   Yes Historical Provider, MD  loratadine-pseudoephedrine (CLARITIN-D 24-HOUR) 10-240 MG per 24 hr tablet Take 1 tablet by mouth daily. 03/12/15  Yes Porfirio Oar, PA-C  norethindrone-ethinyl estradiol 1/35 (ORTHO-NOVUM, NORTREL,CYCLAFEM) tablet Take 1 tablet by mouth daily. take continuously, skip non-hormone containing tablets. 03/12/15  Yes Oval Moralez, PA-C  Tiotropium Bromide Monohydrate (SPIRIVA RESPIMAT) 1.25 MCG/ACT AERS Inhale into the lungs.   Yes Historical Provider, MD     Past Medical, Surgical Family and Social History reviewed and updated.        Objective:  Physical Exam  Constitutional: She is oriented to person, place, and time. She appears well-developed and well-nourished. She is active  and cooperative. No distress.  BP 118/76 (BP Location: Right Arm, Patient Position: Sitting, Cuff Size: Large)   Pulse (!) 102   Temp 98.2 F (36.8 C) (Oral)   Resp 18   Ht 5' 3.5" (1.613 m)   Wt 245 lb (111.1 kg)   LMP 03/13/2016   SpO2  96%   BMI 42.72 kg/m   HENT:  Head: Normocephalic and atraumatic.  Right Ear: Hearing normal.  Left Ear: Hearing normal.  Eyes: Conjunctivae are normal. No scleral icterus.  Neck: Normal range of motion. Neck supple. No thyromegaly present.  Cardiovascular: Normal rate, regular rhythm and normal heart sounds.   Pulses:      Radial pulses are 2+ on the right side, and 2+ on the left side.  Pulmonary/Chest: Effort normal and breath sounds normal.  Lymphadenopathy:       Head (right side): No tonsillar, no preauricular, no posterior auricular and no occipital adenopathy present.       Head (left side): No tonsillar, no preauricular, no posterior auricular and no occipital adenopathy present.    She has no cervical adenopathy.       Right: No supraclavicular adenopathy present.       Left: No supraclavicular adenopathy present.  Neurological: She is alert and oriented to person, place, and time. No sensory deficit.  Skin: Skin is warm and intact. No rash noted. She is diaphoretic (mildly). No cyanosis or erythema. Nails show no clubbing.  Psychiatric: She has a normal mood and affect. Her speech is normal and behavior is normal.           Assessment & Plan:  1. Environmental and seasonal allergies 2. Cough variant asthma with component of UACS Continue maintenance therapy and follow Dr. Zenaida NieceVan Winkle's recommendations.  3. Need for prophylactic vaccination and inoculation against influenza - Flu Vaccine QUAD 36+ mos IM  4. Need for pneumococcal vaccination - Pneumococcal polysaccharide vaccine 23-valent greater than or equal to 2yo subcutaneous/IM  5. Endometriosis She needs a refill for COC for symptom management of endometriosis. - norethindrone-ethinyl estradiol 1/35 (ORTHO-NOVUM, NORTREL,CYCLAFEM) tablet; Take 1 tablet by mouth daily. take continuously, skip non-hormone containing tablets.  Dispense: 4 Package; Refill: 4   Fernande Brashelle S. Jolynn Bajorek, PA-C Physician  Assistant-Certified [facility]

## 2016-04-13 NOTE — Progress Notes (Signed)
Subjective:    Patient ID: Nancy Franklin, female    DOB: 1971/04/08, 45 y.o.   MRN: [id number] Chief Complaint  Patient presents with  . Flu Vaccine    poss pneumonia shot  . Follow-up    last visit    HPI Presents today for follow-up for allergy related issues. Patient reports generally good health, but has a few questions regarding her allergy symptoms. She recently saw an allergist (Dr. Stefano Gaul) who recommended she start taking allergy shots, however, he would like her to be healthy and symptom-free for at least 3 months.   Today patient is asking if there are any therapeutic agents that could keep her symptom-free for 3 months so she may receive this therapy from Dr. Alvin Critchley. Patient reports making a winter plan to decrease the likelihood of becoming ill. 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 Reports unable to exercise due to breathing. Will use albuterol inhaler, then the nebulizer if she is still having difficulty breathing. Reports taking fluticisone in the morning as well at night. This has helped her tremendously throughout day.    Past Medical History:  Diagnosis Date  . Allergy   . Endometriosis   . Ovarian cyst    Family History  Problem Relation Age of Onset  . Diabetes Mother   . Arthritis Mother     Rheumatoid and Osteoarthritis  . Stroke Mother   . Heart disease Paternal Grandfather    Social History   Social History  . Marital status: Single    Spouse name: n/a  . Number of children: 0  . Years of education: 31   Occupational History  . THERAPIST [employer]   Social History Main Topics  . Smoking status: Never Smoker  . Smokeless tobacco: Never Used  . Alcohol use No  . Drug use: No  . Sexual activity: Yes    Partners: Male    Birth control/ protection: Pill   Other Topics Concern  . Not on file   Social History Narrative   Continues  in [employer].  Looking to expand it and other contractural jobs with insurance, or look into an individual policy, effective January 2014.   Current Outpatient Prescriptions on File Prior to Visit  Medication Sig Dispense Refill  . beclomethasone (QVAR) 80 MCG/ACT inhaler Inhale 2 puffs into the lungs 2 (two) times daily. 1 Inhaler 0  . benzonatate (TESSALON) 200 MG capsule Take 1 capsule (200 mg total) by mouth 3 (three) times daily as needed for cough. 40 capsule 0  . fluticasone (FLONASE) 50 MCG/ACT nasal spray Place 2 sprays into both nostrils at bedtime. 54 g 3  . loratadine-pseudoephedrine (CLARITIN-D 24-HOUR) 10-240 MG per 24 hr tablet Take 1 tablet by mouth daily. 90 tablet 4  . norethindrone-ethinyl estradiol 1/35 (ORTHO-NOVUM, NORTREL,CYCLAFEM) tablet Take 1 tablet by mouth daily. take continuously, skip non-hormone containing tablets. 4 Package 4   No current facility-administered medications on file prior to visit.        All ROS patient reports are allergy related and happen occasional  Review of Systems  Constitutional: Positive for fatigue. Negative for activity change, appetite change, chills and fever.  HENT: Positive for congestion, ear discharge, ear pain, sinus pressure and voice change. Negative for rhinorrhea.   Eyes: Negative for photophobia and visual disturbance.  Respiratory: Positive for cough, choking, chest tightness, shortness of breath and wheezing.  Cardiovascular: Negative for chest pain, palpitations and leg swelling.  Gastrointestinal: Negative for abdominal pain, diarrhea, nausea and vomiting.  Genitourinary: Negative for dysuria, frequency and urgency.  Musculoskeletal: Positive for myalgias. Negative for back pain.  Skin: Negative for rash.  Neurological: Positive for dizziness (Allergy related) and headaches (Allergy related). Negative for numbness.  Psychiatric/Behavioral: Negative for agitation and dysphoric mood.       Objective:    Physical Exam  Constitutional: She appears well-developed and well-nourished. No distress.  HENT:  Head: Normocephalic and atraumatic.  Right Ear: External ear normal.  Left Ear: External ear normal.  Mouth/Throat: Oropharynx is clear and moist. No oropharyngeal exudate.  Eyes: Conjunctivae are normal. Pupils are equal, round, and reactive to light. Right eye exhibits no discharge. Left eye exhibits no discharge.  Neck: Neck supple.  Neck swelling   Cardiovascular: Normal rate, regular rhythm, normal heart sounds and intact distal pulses.  Exam reveals no gallop and no friction rub.   No murmur heard. Pulmonary/Chest: Effort normal and breath sounds normal. No respiratory distress. She has no wheezes.  Abdominal: She exhibits no distension.  Lymphadenopathy:    She has cervical adenopathy.  Neurological: She is alert.  Skin: Skin is warm. No rash noted. She is diaphoretic.  Wet, clammy skin   Psychiatric: She has a normal mood and affect. Her behavior is normal.  BP 118/76 (BP Location: Right Arm, Patient Position: Sitting, Cuff Size: Large)   Pulse (!) 102   Temp 98.2 F (36.8 C) (Oral)   Resp 18   Ht 5' 3.5" (1.613 m)   Wt 245 lb (111.1 kg)   LMP 03/13/2016   SpO2 96%   BMI 42.72 kg/m       Assessment & Plan:  1. Environmental and seasonal allergies Symptoms have improved over the past few weeks. Being seen by specialist Dr. Irena CordsVan Winkle.   2. Cough variant asthma with component of UACS See note above   3. Need for prophylactic vaccination and inoculation against influenza - Flu Vaccine QUAD 36+ mos IM  4. Need for pneumococcal vaccination Given pulmonary hx, pneumococcal vaccine is appropriate. Booster at age 45 - Pneumococcal polysaccharide vaccine 23-valent greater than or equal to 2yo subcutaneous/IM  5. Endometriosis Well controlled on birth control. Needs refill - norethindrone-ethinyl estradiol 1/35 (ORTHO-NOVUM, NORTREL,CYCLAFEM) tablet; Take 1 tablet by  mouth daily. take continuously, skip non-hormone containing tablets.  Dispense: 4 Package; Refill: 4

## 2016-04-22 MED FILL — LEVOCETIRIZINE 5 MG TABLET: 5 | 30 days supply | Qty: 30 | Fill #1

## 2016-05-22 MED FILL — LEVOCETIRIZINE 5 MG TABLET: 5 | 30 days supply | Qty: 30 | Fill #2

## 2016-05-22 MED FILL — SPIRIVA RESPIMAT 1.25 MCG I: 1.25 | 30 days supply | Qty: 4 | Fill #2

## 2016-06-15 MED FILL — DASETTA 1-35-28 TABLET: 1-35 | 84 days supply | Qty: 112 | Fill #0

## 2016-06-29 MED FILL — LEVOCETIRIZINE 5 MG TABLET: 5 | 30 days supply | Qty: 30 | Fill #3

## 2016-07-30 MED FILL — SPIRIVA RESPIMAT 1.25 MCG I: 1.25 | 30 days supply | Qty: 4 | Fill #3

## 2016-08-06 ENCOUNTER — Ambulatory Visit: Payer: BLUE CROSS/BLUE SHIELD

## 2016-08-10 ENCOUNTER — Telehealth: Payer: Self-pay | Admitting: Family Medicine

## 2016-08-10 NOTE — Telephone Encounter (Signed)
Pt calling just wanted us to know when she had an appt with Chelle that in her My Chart it stated No Show but we was closed due to weather and she couldn't get out of drive way

## 2016-08-12 ENCOUNTER — Telehealth: Payer: Self-pay

## 2016-08-12 NOTE — Telephone Encounter (Signed)
lvm for pt to call and reschedule appt from 08/06/16

## 2016-08-21 ENCOUNTER — Ambulatory Visit (INDEPENDENT_AMBULATORY_CARE_PROVIDER_SITE_OTHER): Payer: BLUE CROSS/BLUE SHIELD | Admitting: Physician Assistant

## 2016-08-21 VITALS — BP 114/74 | HR 79 | Temp 98.1°F | Resp 16 | Ht 63.5 in | Wt 250.0 lb

## 2016-08-21 DIAGNOSIS — J029 Acute pharyngitis, unspecified: Secondary | ICD-10-CM | POA: Diagnosis not present

## 2016-08-21 DIAGNOSIS — J329 Chronic sinusitis, unspecified: Secondary | ICD-10-CM

## 2016-08-21 LAB — POCT RAPID STREP A (OFFICE): Rapid Strep A Screen: NEGATIVE

## 2016-08-21 MED ORDER — CLARITHROMYCIN ER 500 MG PO TB24: 1000.0000 mg | ORAL_TABLET | Freq: Every day | ORAL | 0 refills | Status: AC

## 2016-08-21 MED ORDER — AMOXICILLIN-POT CLAVULANATE 875-125 MG PO TABS
1.0000 | ORAL_TABLET | Freq: Two times a day (BID) | ORAL | 0 refills | Status: DC
Start: 2016-08-21 — End: 2016-08-21

## 2016-08-21 MED FILL — LEVOCETIRIZINE 5 MG TABLET: 5 | 30 days supply | Qty: 30 | Fill #4

## 2016-08-21 MED FILL — CLARITHROMYCIN ER 500 MG TA: 500 | 10 days supply | Qty: 20 | Fill #0

## 2016-08-21 NOTE — Progress Notes (Signed)
Patient ID: Nancy Franklin, female    DOB: 05/20/1971, 46 y.o.   MRN: [id number]  PCP: Porfirio Oarhelle Keandra Medero, PA-C  Chief Complaint  Patient presents with  . Laryngitis  . Sinusitis    Subjective:   Presents for evaluation of possible sinusitis.  She has a long history of sinobronchitis and prolonged steroid dependent respiratory illnesses. She has done much better recently on her current maintenance regimen, but since Christmas, she's been battling "something coming on."  Has been avoiding coming in, using multiple OTC treatments.  4 days ago she got what she thought was food poisoning. Scratchy throat. Vomited phlegm for several hours. Yesterday her voice became hoarse. Got good rest last night. It feels like strep.  Mild, occasional cough. Intermittent sneezing. Tingling in my teeth. Forehead feels like concrete. "Connelly Spruell, honestly, this feels like strep."  As a therapist, she is in close contact with young clients. She has made it her practice to require those with respiratory illness to reschedule sessions. She had 12 sessions yesterday.  No GI/GU symptoms. No fever, chills. No body aches.   Review of Systems As above.    Patient Active Problem List   Diagnosis Date Noted  . Cholelithiasis 10/29/2015  . Cough variant asthma with component of UACS 09/20/2015  . Environmental and seasonal allergies 03/12/2015  . HYPERLIPIDEMIA 09/23/2007  . OBESITY, UNSPECIFIED 09/23/2007  . ENDOMETRIOSIS, SITE UNSPECIFIED 09/23/2007  . OVARIAN CYST 09/23/2007     Prior to Admission medications   Medication Sig Start Date End Date Taking? Authorizing Provider  beclomethasone (QVAR) 80 MCG/ACT inhaler Inhale 2 puffs into the lungs 2 (two) times daily. 03/13/16  Yes Leonna Schlee, PA-C  fluticasone (FLONASE) 50 MCG/ACT nasal spray Place 2 sprays into both nostrils at bedtime. 09/03/15  Yes Sherren MochaEva N Shaw, MD  levocetirizine (XYZAL) 5 MG tablet Take 5 mg by mouth every evening.   Yes  Historical Provider, MD  loratadine-pseudoephedrine (CLARITIN-D 24-HOUR) 10-240 MG per 24 hr tablet Take 1 tablet by mouth daily. 03/12/15  Yes Porfirio Oarhelle Jamaris Biernat, PA-C  norethindrone-ethinyl estradiol 1/35 (ORTHO-NOVUM, NORTREL,CYCLAFEM) tablet Take 1 tablet by mouth daily. take continuously, skip non-hormone containing tablets. 04/13/16  Yes Rumi Kolodziej, PA-C  Tiotropium Bromide Monohydrate (SPIRIVA RESPIMAT) 1.25 MCG/ACT AERS Inhale into the lungs.   Yes Historical Provider, MD  benzonatate (TESSALON) 200 MG capsule Take 1 capsule (200 mg total) by mouth 3 (three) times daily as needed for cough. Patient not taking: Reported on 08/21/2016 03/10/16   Porfirio Oarhelle Nickolai Rinks, PA-C     Allergies  Allergen Reactions  . Latex   . Prednisone Other (See Comments)    Light headness, dizzy, mania (with inhaled steroid) and paranoia and hallucinations (oral steroids)       Objective:  Physical Exam  Constitutional: She is oriented to person, place, and time. She appears well-developed and well-nourished. No distress.  BP 114/74   Pulse 79   Temp 98.1 F (36.7 C) (Oral)   Resp 16   Ht 5' 3.5" (1.613 m)   Wt 250 lb (113.4 kg)   SpO2 97%   BMI 43.59 kg/m    HENT:  Head: Normocephalic and atraumatic.  Right Ear: Hearing, tympanic membrane, external ear and ear canal normal.  Left Ear: Hearing, tympanic membrane, external ear and ear canal normal.  Nose: Mucosal edema and rhinorrhea present.  No foreign bodies. Right sinus exhibits maxillary sinus tenderness. Right sinus exhibits no frontal sinus tenderness. Left sinus exhibits maxillary sinus tenderness. Left sinus  exhibits no frontal sinus tenderness.  Mouth/Throat: Uvula is midline, oropharynx is clear and moist and mucous membranes are normal. No uvula swelling. No oropharyngeal exudate.  Eyes: Conjunctivae and EOM are normal. Pupils are equal, round, and reactive to light. Right eye exhibits no discharge. Left eye exhibits no discharge. No scleral  icterus.  Neck: Trachea normal, normal range of motion and full passive range of motion without pain. Neck supple. No thyroid mass and no thyromegaly present.  Cardiovascular: Normal rate, regular rhythm and normal heart sounds.   Pulmonary/Chest: Effort normal and breath sounds normal.  Lymphadenopathy:       Head (right side): No submandibular, no tonsillar, no preauricular, no posterior auricular and no occipital adenopathy present.       Head (left side): No submandibular, no tonsillar, no preauricular and no occipital adenopathy present.    She has no cervical adenopathy.       Right: No supraclavicular adenopathy present.       Left: No supraclavicular adenopathy present.  Neurological: She is alert and oriented to person, place, and time. She has normal strength. No cranial nerve deficit or sensory deficit.  Skin: Skin is warm, dry and intact. No rash noted.  Psychiatric: She has a normal mood and affect. Her speech is normal and behavior is normal.    Results for orders placed or performed in visit on 08/21/16  POCT rapid strep A  Result Value Ref Range   Rapid Strep A Screen Negative Negative          Assessment & Plan:   1. Sore throat Await TCx, but doubt strep throat. - Culture, Group A Strep - POCT rapid strep A  2. Sinusitis, unspecified chronicity, unspecified location Supportive care.  Anticipatory guidance.  RTC if symptoms worsen/persist. - clarithromycin (BIAXIN XL) 500 MG 24 hr tablet; Take 2 tablets (1,000 mg total) by mouth daily.  Dispense: 20 tablet; Refill: 0   Fernande Bras, PA-C Physician Assistant-Certified Primary Care at [facility]

## 2016-08-21 NOTE — Patient Instructions (Signed)
     IF you received an x-ray today, you will receive an invoice from [facility]. Please contact [facility] at [phone] with questions or concerns regarding your invoice.   IF you received labwork today, you will receive an invoice from [facility]. Please contact [facility] at [phone] with questions or concerns regarding your invoice.   Our billing staff will not be able to assist you with questions regarding bills from these companies.  You will be contacted with the lab results as soon as they are available. The fastest way to get your results is to activate your My Chart account. Instructions are located on the last page of this paperwork. If you have not heard from us regarding the results in 2 weeks, please contact this office.     

## 2016-08-24 LAB — CULTURE, GROUP A STREP: STREP A CULTURE: NEGATIVE

## 2016-09-07 DIAGNOSIS — J3089 Other allergic rhinitis: Secondary | ICD-10-CM | POA: Diagnosis not present

## 2016-09-07 DIAGNOSIS — J301 Allergic rhinitis due to pollen: Secondary | ICD-10-CM | POA: Diagnosis not present

## 2016-09-07 DIAGNOSIS — J4540 Moderate persistent asthma, uncomplicated: Secondary | ICD-10-CM | POA: Diagnosis not present

## 2016-09-07 DIAGNOSIS — H1045 Other chronic allergic conjunctivitis: Secondary | ICD-10-CM | POA: Diagnosis not present

## 2016-09-10 DIAGNOSIS — J3081 Allergic rhinitis due to animal (cat) (dog) hair and dander: Secondary | ICD-10-CM | POA: Diagnosis not present

## 2016-09-10 DIAGNOSIS — J301 Allergic rhinitis due to pollen: Secondary | ICD-10-CM | POA: Diagnosis not present

## 2016-09-11 DIAGNOSIS — J3089 Other allergic rhinitis: Secondary | ICD-10-CM | POA: Diagnosis not present

## 2016-09-18 ENCOUNTER — Other Ambulatory Visit: Payer: Self-pay | Admitting: Physician Assistant

## 2016-09-18 DIAGNOSIS — J3089 Other allergic rhinitis: Secondary | ICD-10-CM

## 2016-09-18 MED FILL — DASETTA 1-35-28 TABLET: 1-35 | 84 days supply | Qty: 112 | Fill #1

## 2016-09-21 MED FILL — ALLERGY RELIEF-NASAL DECONG: 10-240 | 90 days supply | Qty: 90 | Fill #0

## 2016-10-05 DIAGNOSIS — J3081 Allergic rhinitis due to animal (cat) (dog) hair and dander: Secondary | ICD-10-CM | POA: Diagnosis not present

## 2016-10-05 DIAGNOSIS — J301 Allergic rhinitis due to pollen: Secondary | ICD-10-CM | POA: Diagnosis not present

## 2016-10-05 DIAGNOSIS — J3089 Other allergic rhinitis: Secondary | ICD-10-CM | POA: Diagnosis not present

## 2016-10-07 DIAGNOSIS — J301 Allergic rhinitis due to pollen: Secondary | ICD-10-CM | POA: Diagnosis not present

## 2016-10-07 DIAGNOSIS — J3081 Allergic rhinitis due to animal (cat) (dog) hair and dander: Secondary | ICD-10-CM | POA: Diagnosis not present

## 2016-10-07 DIAGNOSIS — J3089 Other allergic rhinitis: Secondary | ICD-10-CM | POA: Diagnosis not present

## 2016-10-09 DIAGNOSIS — J3081 Allergic rhinitis due to animal (cat) (dog) hair and dander: Secondary | ICD-10-CM | POA: Diagnosis not present

## 2016-10-09 DIAGNOSIS — J301 Allergic rhinitis due to pollen: Secondary | ICD-10-CM | POA: Diagnosis not present

## 2016-10-09 DIAGNOSIS — J3089 Other allergic rhinitis: Secondary | ICD-10-CM | POA: Diagnosis not present

## 2016-10-12 DIAGNOSIS — J3081 Allergic rhinitis due to animal (cat) (dog) hair and dander: Secondary | ICD-10-CM | POA: Diagnosis not present

## 2016-10-12 DIAGNOSIS — J301 Allergic rhinitis due to pollen: Secondary | ICD-10-CM | POA: Diagnosis not present

## 2016-10-12 DIAGNOSIS — J3089 Other allergic rhinitis: Secondary | ICD-10-CM | POA: Diagnosis not present

## 2016-10-14 DIAGNOSIS — J3081 Allergic rhinitis due to animal (cat) (dog) hair and dander: Secondary | ICD-10-CM | POA: Diagnosis not present

## 2016-10-14 DIAGNOSIS — J301 Allergic rhinitis due to pollen: Secondary | ICD-10-CM | POA: Diagnosis not present

## 2016-10-14 DIAGNOSIS — J3089 Other allergic rhinitis: Secondary | ICD-10-CM | POA: Diagnosis not present

## 2016-10-19 DIAGNOSIS — J3081 Allergic rhinitis due to animal (cat) (dog) hair and dander: Secondary | ICD-10-CM | POA: Diagnosis not present

## 2016-10-19 DIAGNOSIS — J301 Allergic rhinitis due to pollen: Secondary | ICD-10-CM | POA: Diagnosis not present

## 2016-10-19 DIAGNOSIS — J3089 Other allergic rhinitis: Secondary | ICD-10-CM | POA: Diagnosis not present

## 2016-10-21 DIAGNOSIS — J301 Allergic rhinitis due to pollen: Secondary | ICD-10-CM | POA: Diagnosis not present

## 2016-10-21 DIAGNOSIS — J3089 Other allergic rhinitis: Secondary | ICD-10-CM | POA: Diagnosis not present

## 2016-10-21 DIAGNOSIS — J3081 Allergic rhinitis due to animal (cat) (dog) hair and dander: Secondary | ICD-10-CM | POA: Diagnosis not present

## 2016-10-23 DIAGNOSIS — J3089 Other allergic rhinitis: Secondary | ICD-10-CM | POA: Diagnosis not present

## 2016-10-23 DIAGNOSIS — J301 Allergic rhinitis due to pollen: Secondary | ICD-10-CM | POA: Diagnosis not present

## 2016-10-23 DIAGNOSIS — J3081 Allergic rhinitis due to animal (cat) (dog) hair and dander: Secondary | ICD-10-CM | POA: Diagnosis not present

## 2016-10-26 DIAGNOSIS — Z634 Disappearance and death of family member: Secondary | ICD-10-CM | POA: Diagnosis not present

## 2016-10-26 DIAGNOSIS — J301 Allergic rhinitis due to pollen: Secondary | ICD-10-CM | POA: Diagnosis not present

## 2016-10-26 DIAGNOSIS — J3081 Allergic rhinitis due to animal (cat) (dog) hair and dander: Secondary | ICD-10-CM | POA: Diagnosis not present

## 2016-10-26 DIAGNOSIS — F4310 Post-traumatic stress disorder, unspecified: Secondary | ICD-10-CM | POA: Diagnosis not present

## 2016-10-26 DIAGNOSIS — J3089 Other allergic rhinitis: Secondary | ICD-10-CM | POA: Diagnosis not present

## 2016-10-28 DIAGNOSIS — J3089 Other allergic rhinitis: Secondary | ICD-10-CM | POA: Diagnosis not present

## 2016-10-28 DIAGNOSIS — J301 Allergic rhinitis due to pollen: Secondary | ICD-10-CM | POA: Diagnosis not present

## 2016-10-28 DIAGNOSIS — J3081 Allergic rhinitis due to animal (cat) (dog) hair and dander: Secondary | ICD-10-CM | POA: Diagnosis not present

## 2016-10-29 MED FILL — LEVOCETIRIZINE 5 MG TABLET: 5 | 30 days supply | Qty: 30 | Fill #5

## 2016-10-29 MED FILL — SPIRIVA RESPIMAT 1.25 MCG I: 1.25 | 30 days supply | Qty: 4 | Fill #4

## 2016-10-30 DIAGNOSIS — J3081 Allergic rhinitis due to animal (cat) (dog) hair and dander: Secondary | ICD-10-CM | POA: Diagnosis not present

## 2016-10-30 DIAGNOSIS — J301 Allergic rhinitis due to pollen: Secondary | ICD-10-CM | POA: Diagnosis not present

## 2016-10-30 DIAGNOSIS — J3089 Other allergic rhinitis: Secondary | ICD-10-CM | POA: Diagnosis not present

## 2016-11-02 DIAGNOSIS — J3089 Other allergic rhinitis: Secondary | ICD-10-CM | POA: Diagnosis not present

## 2016-11-02 DIAGNOSIS — J3081 Allergic rhinitis due to animal (cat) (dog) hair and dander: Secondary | ICD-10-CM | POA: Diagnosis not present

## 2016-11-02 DIAGNOSIS — J301 Allergic rhinitis due to pollen: Secondary | ICD-10-CM | POA: Diagnosis not present

## 2016-11-09 DIAGNOSIS — J3089 Other allergic rhinitis: Secondary | ICD-10-CM | POA: Diagnosis not present

## 2016-11-09 DIAGNOSIS — J301 Allergic rhinitis due to pollen: Secondary | ICD-10-CM | POA: Diagnosis not present

## 2016-11-09 DIAGNOSIS — J3081 Allergic rhinitis due to animal (cat) (dog) hair and dander: Secondary | ICD-10-CM | POA: Diagnosis not present

## 2016-11-14 IMAGING — CR DG CHEST 2V
2 series · 2 of 2 positions shown · non-contrast
Comparison: 09/03/2015

CLINICAL DATA: Bilateral pneumonia.  Follow-up.

EXAM:
CHEST  2 VIEW

[PA]
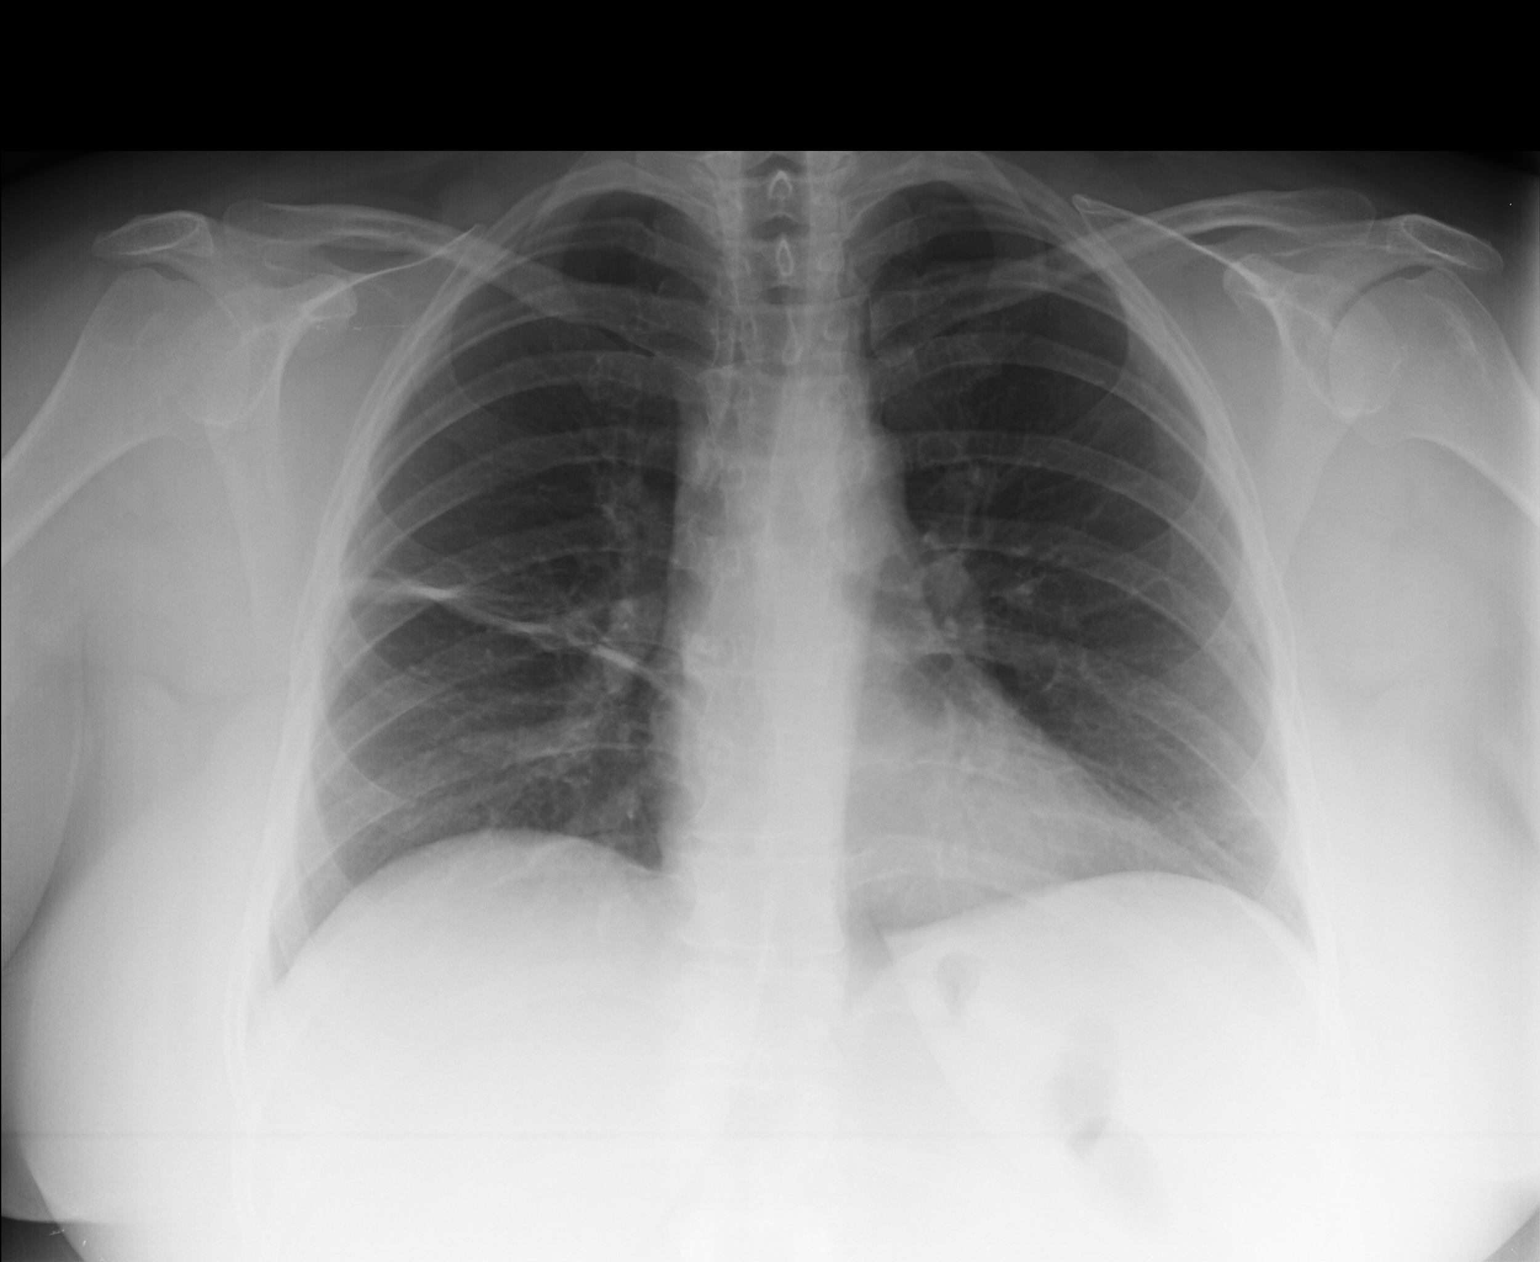

[lateral]
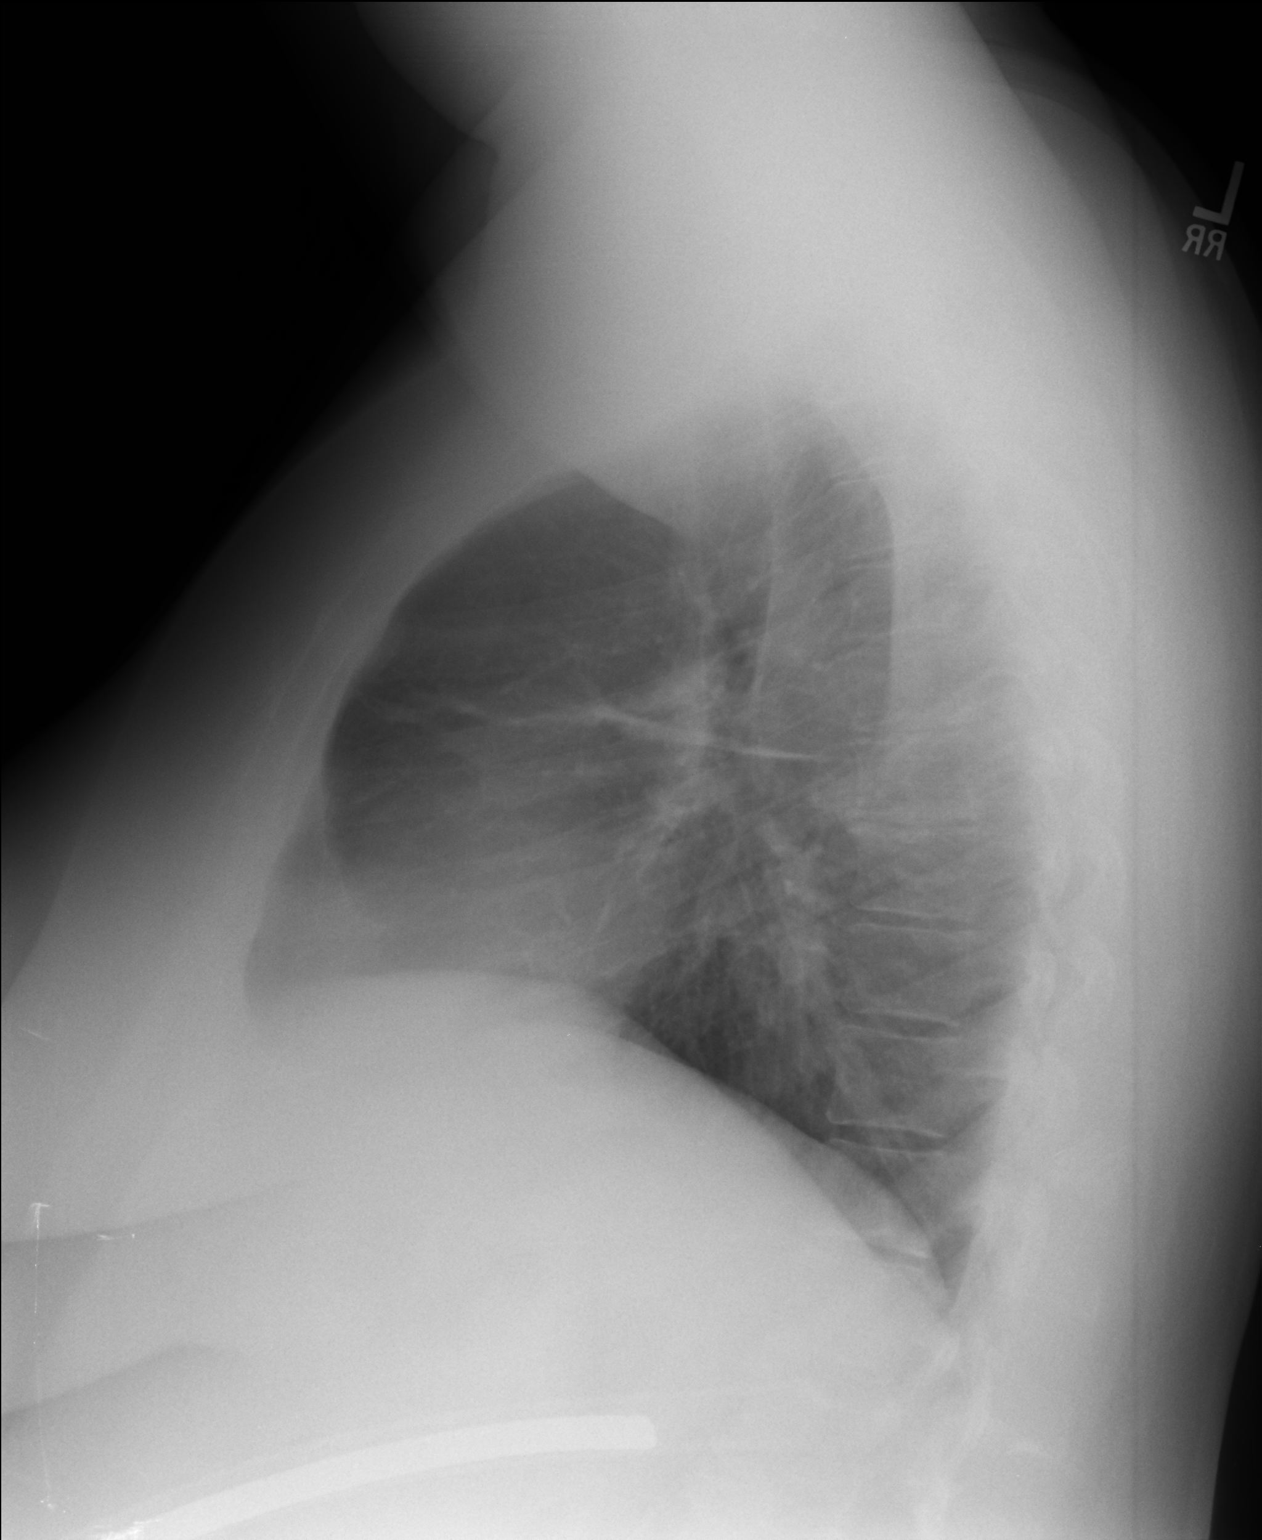

[2 of 2 positions shown; findings below may reference images not displayed]

FINDINGS: Persistent linear density in the right mid lung compatible with
subsegmental atelectasis, stable. Left lung is clear. Heart is
normal size. No effusions. No acute bony abnormality.
IMPRESSION: Stable right mid atelectasis.  No acute findings.

## 2016-11-16 DIAGNOSIS — J301 Allergic rhinitis due to pollen: Secondary | ICD-10-CM | POA: Diagnosis not present

## 2016-11-16 DIAGNOSIS — J3081 Allergic rhinitis due to animal (cat) (dog) hair and dander: Secondary | ICD-10-CM | POA: Diagnosis not present

## 2016-11-16 DIAGNOSIS — J3089 Other allergic rhinitis: Secondary | ICD-10-CM | POA: Diagnosis not present

## 2016-11-19 DIAGNOSIS — J301 Allergic rhinitis due to pollen: Secondary | ICD-10-CM | POA: Diagnosis not present

## 2016-11-19 DIAGNOSIS — J3081 Allergic rhinitis due to animal (cat) (dog) hair and dander: Secondary | ICD-10-CM | POA: Diagnosis not present

## 2016-11-19 DIAGNOSIS — J3089 Other allergic rhinitis: Secondary | ICD-10-CM | POA: Diagnosis not present

## 2016-11-23 DIAGNOSIS — J3081 Allergic rhinitis due to animal (cat) (dog) hair and dander: Secondary | ICD-10-CM | POA: Diagnosis not present

## 2016-11-23 DIAGNOSIS — J301 Allergic rhinitis due to pollen: Secondary | ICD-10-CM | POA: Diagnosis not present

## 2016-11-23 DIAGNOSIS — J3089 Other allergic rhinitis: Secondary | ICD-10-CM | POA: Diagnosis not present

## 2016-11-30 DIAGNOSIS — J3081 Allergic rhinitis due to animal (cat) (dog) hair and dander: Secondary | ICD-10-CM | POA: Diagnosis not present

## 2016-11-30 DIAGNOSIS — J301 Allergic rhinitis due to pollen: Secondary | ICD-10-CM | POA: Diagnosis not present

## 2016-11-30 DIAGNOSIS — J3089 Other allergic rhinitis: Secondary | ICD-10-CM | POA: Diagnosis not present

## 2016-12-04 DIAGNOSIS — J3081 Allergic rhinitis due to animal (cat) (dog) hair and dander: Secondary | ICD-10-CM | POA: Diagnosis not present

## 2016-12-04 DIAGNOSIS — J3089 Other allergic rhinitis: Secondary | ICD-10-CM | POA: Diagnosis not present

## 2016-12-04 DIAGNOSIS — J301 Allergic rhinitis due to pollen: Secondary | ICD-10-CM | POA: Diagnosis not present

## 2016-12-08 MED FILL — ALYACEN 1-35-28 TABLET: 1-35 | 84 days supply | Qty: 112 | Fill #2

## 2016-12-08 MED FILL — LEVOCETIRIZINE 5 MG TABLET: 5 | 30 days supply | Qty: 30 | Fill #0

## 2016-12-11 DIAGNOSIS — J301 Allergic rhinitis due to pollen: Secondary | ICD-10-CM | POA: Diagnosis not present

## 2016-12-11 DIAGNOSIS — J3089 Other allergic rhinitis: Secondary | ICD-10-CM | POA: Diagnosis not present

## 2016-12-11 DIAGNOSIS — J3081 Allergic rhinitis due to animal (cat) (dog) hair and dander: Secondary | ICD-10-CM | POA: Diagnosis not present

## 2016-12-29 DIAGNOSIS — J301 Allergic rhinitis due to pollen: Secondary | ICD-10-CM | POA: Diagnosis not present

## 2016-12-29 DIAGNOSIS — J3089 Other allergic rhinitis: Secondary | ICD-10-CM | POA: Diagnosis not present

## 2016-12-29 DIAGNOSIS — J3081 Allergic rhinitis due to animal (cat) (dog) hair and dander: Secondary | ICD-10-CM | POA: Diagnosis not present

## 2017-01-04 DIAGNOSIS — J3081 Allergic rhinitis due to animal (cat) (dog) hair and dander: Secondary | ICD-10-CM | POA: Diagnosis not present

## 2017-01-04 DIAGNOSIS — J301 Allergic rhinitis due to pollen: Secondary | ICD-10-CM | POA: Diagnosis not present

## 2017-01-04 DIAGNOSIS — J3089 Other allergic rhinitis: Secondary | ICD-10-CM | POA: Diagnosis not present

## 2017-01-06 DIAGNOSIS — J301 Allergic rhinitis due to pollen: Secondary | ICD-10-CM | POA: Diagnosis not present

## 2017-01-06 DIAGNOSIS — J3081 Allergic rhinitis due to animal (cat) (dog) hair and dander: Secondary | ICD-10-CM | POA: Diagnosis not present

## 2017-01-06 DIAGNOSIS — J3089 Other allergic rhinitis: Secondary | ICD-10-CM | POA: Diagnosis not present

## 2017-01-13 DIAGNOSIS — H1045 Other chronic allergic conjunctivitis: Secondary | ICD-10-CM | POA: Diagnosis not present

## 2017-01-13 DIAGNOSIS — J3089 Other allergic rhinitis: Secondary | ICD-10-CM | POA: Diagnosis not present

## 2017-01-13 DIAGNOSIS — J4540 Moderate persistent asthma, uncomplicated: Secondary | ICD-10-CM | POA: Diagnosis not present

## 2017-01-13 DIAGNOSIS — J301 Allergic rhinitis due to pollen: Secondary | ICD-10-CM | POA: Diagnosis not present

## 2017-01-13 MED FILL — ARNUITY ELLIPTA 100 MCG INH: 100 | 30 days supply | Qty: 30 | Fill #0

## 2017-01-25 DIAGNOSIS — J3089 Other allergic rhinitis: Secondary | ICD-10-CM | POA: Diagnosis not present

## 2017-01-25 DIAGNOSIS — J301 Allergic rhinitis due to pollen: Secondary | ICD-10-CM | POA: Diagnosis not present

## 2017-01-25 DIAGNOSIS — J3081 Allergic rhinitis due to animal (cat) (dog) hair and dander: Secondary | ICD-10-CM | POA: Diagnosis not present

## 2017-01-27 DIAGNOSIS — J301 Allergic rhinitis due to pollen: Secondary | ICD-10-CM | POA: Diagnosis not present

## 2017-01-27 DIAGNOSIS — J3089 Other allergic rhinitis: Secondary | ICD-10-CM | POA: Diagnosis not present

## 2017-01-27 DIAGNOSIS — J3081 Allergic rhinitis due to animal (cat) (dog) hair and dander: Secondary | ICD-10-CM | POA: Diagnosis not present

## 2017-02-01 DIAGNOSIS — J301 Allergic rhinitis due to pollen: Secondary | ICD-10-CM | POA: Diagnosis not present

## 2017-02-01 DIAGNOSIS — J3089 Other allergic rhinitis: Secondary | ICD-10-CM | POA: Diagnosis not present

## 2017-02-01 DIAGNOSIS — J3081 Allergic rhinitis due to animal (cat) (dog) hair and dander: Secondary | ICD-10-CM | POA: Diagnosis not present

## 2017-02-03 MED FILL — SPIRIVA RESPIMAT 1.25 MCG I: 1.25 | 30 days supply | Qty: 4 | Fill #5

## 2017-02-04 MED FILL — LEVOCETIRIZINE 5 MG TABLET: 5 | 30 days supply | Qty: 30 | Fill #1

## 2017-02-11 DIAGNOSIS — J301 Allergic rhinitis due to pollen: Secondary | ICD-10-CM | POA: Diagnosis not present

## 2017-02-11 DIAGNOSIS — J3081 Allergic rhinitis due to animal (cat) (dog) hair and dander: Secondary | ICD-10-CM | POA: Diagnosis not present

## 2017-02-11 DIAGNOSIS — J3089 Other allergic rhinitis: Secondary | ICD-10-CM | POA: Diagnosis not present

## 2017-02-15 DIAGNOSIS — J301 Allergic rhinitis due to pollen: Secondary | ICD-10-CM | POA: Diagnosis not present

## 2017-02-15 DIAGNOSIS — J3081 Allergic rhinitis due to animal (cat) (dog) hair and dander: Secondary | ICD-10-CM | POA: Diagnosis not present

## 2017-02-15 DIAGNOSIS — J3089 Other allergic rhinitis: Secondary | ICD-10-CM | POA: Diagnosis not present

## 2017-02-16 DIAGNOSIS — J301 Allergic rhinitis due to pollen: Secondary | ICD-10-CM | POA: Diagnosis not present

## 2017-02-16 DIAGNOSIS — J3081 Allergic rhinitis due to animal (cat) (dog) hair and dander: Secondary | ICD-10-CM | POA: Diagnosis not present

## 2017-02-17 DIAGNOSIS — J3089 Other allergic rhinitis: Secondary | ICD-10-CM | POA: Diagnosis not present

## 2017-02-26 DIAGNOSIS — J3081 Allergic rhinitis due to animal (cat) (dog) hair and dander: Secondary | ICD-10-CM | POA: Diagnosis not present

## 2017-02-26 DIAGNOSIS — J301 Allergic rhinitis due to pollen: Secondary | ICD-10-CM | POA: Diagnosis not present

## 2017-02-26 DIAGNOSIS — J3089 Other allergic rhinitis: Secondary | ICD-10-CM | POA: Diagnosis not present

## 2017-03-03 DIAGNOSIS — J3089 Other allergic rhinitis: Secondary | ICD-10-CM | POA: Diagnosis not present

## 2017-03-03 DIAGNOSIS — J301 Allergic rhinitis due to pollen: Secondary | ICD-10-CM | POA: Diagnosis not present

## 2017-03-03 DIAGNOSIS — J3081 Allergic rhinitis due to animal (cat) (dog) hair and dander: Secondary | ICD-10-CM | POA: Diagnosis not present

## 2017-03-03 MED FILL — ALYACEN 1-35-28 TABLET: 1-35 | 84 days supply | Qty: 112 | Fill #3

## 2017-03-03 MED FILL — LEVOCETIRIZINE 5 MG TABLET: 5 | 30 days supply | Qty: 30 | Fill #2

## 2017-03-08 DIAGNOSIS — J301 Allergic rhinitis due to pollen: Secondary | ICD-10-CM | POA: Diagnosis not present

## 2017-03-08 DIAGNOSIS — J4540 Moderate persistent asthma, uncomplicated: Secondary | ICD-10-CM | POA: Diagnosis not present

## 2017-03-08 DIAGNOSIS — H1045 Other chronic allergic conjunctivitis: Secondary | ICD-10-CM | POA: Diagnosis not present

## 2017-03-08 DIAGNOSIS — J3081 Allergic rhinitis due to animal (cat) (dog) hair and dander: Secondary | ICD-10-CM | POA: Diagnosis not present

## 2017-03-08 DIAGNOSIS — J3089 Other allergic rhinitis: Secondary | ICD-10-CM | POA: Diagnosis not present

## 2017-03-10 DIAGNOSIS — J3089 Other allergic rhinitis: Secondary | ICD-10-CM | POA: Diagnosis not present

## 2017-03-10 DIAGNOSIS — J301 Allergic rhinitis due to pollen: Secondary | ICD-10-CM | POA: Diagnosis not present

## 2017-03-10 DIAGNOSIS — J3081 Allergic rhinitis due to animal (cat) (dog) hair and dander: Secondary | ICD-10-CM | POA: Diagnosis not present

## 2017-03-15 DIAGNOSIS — J301 Allergic rhinitis due to pollen: Secondary | ICD-10-CM | POA: Diagnosis not present

## 2017-03-15 DIAGNOSIS — J3081 Allergic rhinitis due to animal (cat) (dog) hair and dander: Secondary | ICD-10-CM | POA: Diagnosis not present

## 2017-03-15 DIAGNOSIS — J3089 Other allergic rhinitis: Secondary | ICD-10-CM | POA: Diagnosis not present

## 2017-03-17 DIAGNOSIS — J3081 Allergic rhinitis due to animal (cat) (dog) hair and dander: Secondary | ICD-10-CM | POA: Diagnosis not present

## 2017-03-17 DIAGNOSIS — J301 Allergic rhinitis due to pollen: Secondary | ICD-10-CM | POA: Diagnosis not present

## 2017-03-17 DIAGNOSIS — J3089 Other allergic rhinitis: Secondary | ICD-10-CM | POA: Diagnosis not present

## 2017-03-24 DIAGNOSIS — J3081 Allergic rhinitis due to animal (cat) (dog) hair and dander: Secondary | ICD-10-CM | POA: Diagnosis not present

## 2017-03-24 DIAGNOSIS — J3089 Other allergic rhinitis: Secondary | ICD-10-CM | POA: Diagnosis not present

## 2017-03-24 DIAGNOSIS — J301 Allergic rhinitis due to pollen: Secondary | ICD-10-CM | POA: Diagnosis not present

## 2017-03-29 DIAGNOSIS — J3089 Other allergic rhinitis: Secondary | ICD-10-CM | POA: Diagnosis not present

## 2017-03-29 DIAGNOSIS — J3081 Allergic rhinitis due to animal (cat) (dog) hair and dander: Secondary | ICD-10-CM | POA: Diagnosis not present

## 2017-03-29 DIAGNOSIS — J301 Allergic rhinitis due to pollen: Secondary | ICD-10-CM | POA: Diagnosis not present

## 2017-04-08 DIAGNOSIS — J301 Allergic rhinitis due to pollen: Secondary | ICD-10-CM | POA: Diagnosis not present

## 2017-04-08 DIAGNOSIS — J3081 Allergic rhinitis due to animal (cat) (dog) hair and dander: Secondary | ICD-10-CM | POA: Diagnosis not present

## 2017-04-08 DIAGNOSIS — J3089 Other allergic rhinitis: Secondary | ICD-10-CM | POA: Diagnosis not present

## 2017-04-12 DIAGNOSIS — J301 Allergic rhinitis due to pollen: Secondary | ICD-10-CM | POA: Diagnosis not present

## 2017-04-12 DIAGNOSIS — J3081 Allergic rhinitis due to animal (cat) (dog) hair and dander: Secondary | ICD-10-CM | POA: Diagnosis not present

## 2017-04-12 DIAGNOSIS — J3089 Other allergic rhinitis: Secondary | ICD-10-CM | POA: Diagnosis not present

## 2017-04-19 DIAGNOSIS — J301 Allergic rhinitis due to pollen: Secondary | ICD-10-CM | POA: Diagnosis not present

## 2017-04-19 DIAGNOSIS — J3081 Allergic rhinitis due to animal (cat) (dog) hair and dander: Secondary | ICD-10-CM | POA: Diagnosis not present

## 2017-04-19 DIAGNOSIS — J3089 Other allergic rhinitis: Secondary | ICD-10-CM | POA: Diagnosis not present

## 2017-04-26 DIAGNOSIS — J301 Allergic rhinitis due to pollen: Secondary | ICD-10-CM | POA: Diagnosis not present

## 2017-04-26 DIAGNOSIS — J3089 Other allergic rhinitis: Secondary | ICD-10-CM | POA: Diagnosis not present

## 2017-04-26 DIAGNOSIS — J3081 Allergic rhinitis due to animal (cat) (dog) hair and dander: Secondary | ICD-10-CM | POA: Diagnosis not present

## 2017-04-26 DIAGNOSIS — Z23 Encounter for immunization: Secondary | ICD-10-CM | POA: Diagnosis not present

## 2017-05-03 MED FILL — LEVOCETIRIZINE 5 MG TABLET: 5 | 30 days supply | Qty: 30 | Fill #3

## 2017-05-05 DIAGNOSIS — J3089 Other allergic rhinitis: Secondary | ICD-10-CM | POA: Diagnosis not present

## 2017-05-05 DIAGNOSIS — J301 Allergic rhinitis due to pollen: Secondary | ICD-10-CM | POA: Diagnosis not present

## 2017-05-05 DIAGNOSIS — J3081 Allergic rhinitis due to animal (cat) (dog) hair and dander: Secondary | ICD-10-CM | POA: Diagnosis not present

## 2017-05-06 MED FILL — SPIRIVA RESPIMAT 1.25 MCG I: 1.25 | 30 days supply | Qty: 4 | Fill #0

## 2017-05-06 MED FILL — FLUTICASONE PROP 50 MCG SPR: 50 | 60 days supply | Qty: 16 | Fill #0

## 2017-05-12 DIAGNOSIS — J3089 Other allergic rhinitis: Secondary | ICD-10-CM | POA: Diagnosis not present

## 2017-05-12 DIAGNOSIS — J301 Allergic rhinitis due to pollen: Secondary | ICD-10-CM | POA: Diagnosis not present

## 2017-05-12 DIAGNOSIS — J3081 Allergic rhinitis due to animal (cat) (dog) hair and dander: Secondary | ICD-10-CM | POA: Diagnosis not present

## 2017-05-18 ENCOUNTER — Telehealth: Payer: Self-pay

## 2017-05-18 NOTE — Telephone Encounter (Signed)
[facility] to [facility] for patient to review overdue health maintenance, however, the message stated that the patient would be out of town for some time. During this time she would be unable to receive calls or check her VM.

## 2017-05-26 DIAGNOSIS — J3089 Other allergic rhinitis: Secondary | ICD-10-CM | POA: Diagnosis not present

## 2017-05-26 DIAGNOSIS — J301 Allergic rhinitis due to pollen: Secondary | ICD-10-CM | POA: Diagnosis not present

## 2017-05-26 DIAGNOSIS — J3081 Allergic rhinitis due to animal (cat) (dog) hair and dander: Secondary | ICD-10-CM | POA: Diagnosis not present

## 2017-06-03 ENCOUNTER — Other Ambulatory Visit: Payer: Self-pay | Admitting: Physician Assistant

## 2017-06-03 DIAGNOSIS — N809 Endometriosis, unspecified: Secondary | ICD-10-CM

## 2017-06-03 NOTE — Telephone Encounter (Signed)
Needs annual well visit? Pap? Please review.

## 2017-06-04 ENCOUNTER — Other Ambulatory Visit: Payer: Self-pay | Admitting: Physician Assistant

## 2017-06-04 DIAGNOSIS — N809 Endometriosis, unspecified: Secondary | ICD-10-CM

## 2017-06-04 MED ORDER — NORETHINDRONE-ETH ESTRADIOL 1-35 MG-MCG PO TABS: 1.0000 | ORAL_TABLET | Freq: Every day | ORAL | 4 refills | Status: DC

## 2017-06-04 MED FILL — ALYACEN 1-35-28 TABLET: 1-35 | 84 days supply | Qty: 112 | Fill #0

## 2017-06-04 NOTE — Telephone Encounter (Signed)
Copied from CRM [phone]#[id number]. Topic: Quick Communication - See Telephone Encounter >> Jun 04, 2017 10:19 AM Jolayne Hainesaylor, Brittany L wrote: CRM for notification. See Telephone encounter for:  Patient called and said she is COMPLETELY out of her birth control & it is called ORTHONOVUM & she said she wants to know if Chelle will write her a script and send it over to [facility] to get her by until she can get in to see her if she needs too.  06/04/17.

## 2017-06-04 NOTE — Telephone Encounter (Signed)
Meds ordered this encounter  Medications  . norethindrone-ethinyl estradiol 1/35 (ORTHO-NOVUM, NORTREL,CYCLAFEM) tablet    Sig: Take 1 tablet daily by mouth. take continuously, skip non-hormone containing tablets.    Dispense:  4 Package    Refill:  4    Please keep patient on same product (currently on DASETTA)    Order Specific Question:   Supervising Provider    Answer:   Clelia CroftSHAW, EVA N [[id number]]    She has an appointment with me next week.

## 2017-06-04 NOTE — Telephone Encounter (Signed)
Copied from CRM [phone]. Topic: Quick Communication - See Telephone Encounter >> Jun 04, 2017 10:19 AM Jolayne Hainesaylor, Brittany L wrote: CRM for notification. See Telephone encounter for:  Patient called and said she is COMPLETELY out of her birth control & it is called ORTHONOVUM & she said she wants to know if Chelle will write her a script and send it over to [facility] to get her by until she can get in to see her if she needs too.  06/04/17. >> Jun 04, 2017 12:48 PM Rudi CocoLathan, Kember Boch M, NT wrote: Pt. Called back to see if she can get a pack of her birth control just enough to last her so she can make an appt. For next week. Pt. Called to get refill on her birth control but was told she had to make an appt. Please call pt. To let her know [phone] Pt. [facility] if before 5pm today

## 2017-06-07 DIAGNOSIS — J3089 Other allergic rhinitis: Secondary | ICD-10-CM | POA: Diagnosis not present

## 2017-06-07 DIAGNOSIS — J301 Allergic rhinitis due to pollen: Secondary | ICD-10-CM | POA: Diagnosis not present

## 2017-06-07 DIAGNOSIS — J3081 Allergic rhinitis due to animal (cat) (dog) hair and dander: Secondary | ICD-10-CM | POA: Diagnosis not present

## 2017-06-09 ENCOUNTER — Other Ambulatory Visit: Payer: Self-pay

## 2017-06-09 ENCOUNTER — Ambulatory Visit: Payer: BLUE CROSS/BLUE SHIELD | Admitting: Physician Assistant

## 2017-06-09 ENCOUNTER — Telehealth: Payer: Self-pay

## 2017-06-09 ENCOUNTER — Encounter: Payer: Self-pay | Admitting: Physician Assistant

## 2017-06-09 VITALS — BP 130/82 | HR 105 | Temp 98.4°F | Resp 16 | Ht 65.75 in | Wt 251.0 lb

## 2017-06-09 DIAGNOSIS — J329 Chronic sinusitis, unspecified: Secondary | ICD-10-CM

## 2017-06-09 DIAGNOSIS — R7989 Other specified abnormal findings of blood chemistry: Secondary | ICD-10-CM

## 2017-06-09 DIAGNOSIS — N83209 Unspecified ovarian cyst, unspecified side: Secondary | ICD-10-CM

## 2017-06-09 DIAGNOSIS — Z1389 Encounter for screening for other disorder: Secondary | ICD-10-CM | POA: Diagnosis not present

## 2017-06-09 DIAGNOSIS — E782 Mixed hyperlipidemia: Secondary | ICD-10-CM

## 2017-06-09 DIAGNOSIS — Z130 Encounter for screening for diseases of the blood and blood-forming organs and certain disorders involving the immune mechanism: Secondary | ICD-10-CM | POA: Diagnosis not present

## 2017-06-09 DIAGNOSIS — N809 Endometriosis, unspecified: Secondary | ICD-10-CM

## 2017-06-09 MED ORDER — CLARITHROMYCIN ER 500 MG PO TB24: 1000.0000 mg | ORAL_TABLET | Freq: Every day | ORAL | 0 refills | Status: DC

## 2017-06-09 MED ORDER — AZELASTINE HCL 0.1 % NA SOLN: 2.0000 | Freq: Two times a day (BID) | NASAL | 99 refills | Status: AC

## 2017-06-09 MED FILL — AZELASTINE 0.1% (137 MCG) S: 0.1 | 30 days supply | Qty: 30 | Fill #0

## 2017-06-09 MED FILL — CLARITHROMYCIN ER 500 MG TA: 500 | 10 days supply | Qty: 20 | Fill #0

## 2017-06-09 NOTE — Progress Notes (Signed)
Patient ID: Algernon Huxleyngela R Mazzie, female    DOB: 01/22/1971, 46 y.o.   MRN: [id number]  PCP: Porfirio OarJeffery, Alysha Doolan, PA-C  Chief Complaint  Patient presents with  . Sinus Problem    sinus pressure with nasal drainage x 1 week.  Pt states she has some cough with mucus also    Subjective:   Presents for evaluation of suspected sinusitis, and to close some health maintenance gaps.  This appointment was originally scheduled to obtain a refill of the COC she takes to manage ovarian cyst and endometriosis. She ran out last week and came by, and I provided the refill at that time.  Scratchy throat and yellow nasal drainage began 8 days ago. She was hoping that she would improve with self and supportive care, but has worsened. The mucous has become much thicker and cough is keeping her awake. Sinus pressure, facial pain. Doesn't tolerate steroids above 20 mg, and prefers to avoid them altogether if possible. Is seeing asthma/allergy, getting immunotherapy injections and using an air purifier, with significant improvement in her day-to-day symptoms.  Used to see Dr. Ellyn HackBovard for GYN, but she has gotten so busy that appointments take hours. She hasn't found someone that she likes as much and requests some suggestions. Is wanting to stay open to the option of conception in the future.    Review of Systems As above.    Patient Active Problem List   Diagnosis Date Noted  . Elevated TSH 06/09/2017  . Cholelithiasis 10/29/2015  . Cough variant asthma with component of UACS 09/20/2015  . Environmental and seasonal allergies 03/12/2015  . HYPERLIPIDEMIA 09/23/2007  . OBESITY, UNSPECIFIED 09/23/2007  . Endometriosis 09/23/2007  . Ovarian cyst 09/23/2007     Prior to Admission medications   Medication Sig Start Date End Date Taking? Authorizing Provider  ALLERGY RELIEF/NASAL DECONGEST 10-240 MG 24 hr tablet TAKE 1 TABLET BY MOUTH ONCE DAILY 09/19/16  Yes Zoiey Christy, PA-C  azelastine (ASTELIN)  0.1 % nasal spray Place into both nostrils 2 (two) times daily. Use in each nostril as directed   Yes [provider]  beclomethasone (QVAR) 80 MCG/ACT inhaler Inhale 2 puffs into the lungs 2 (two) times daily. 03/13/16  Yes Zoee Heeney, PA-C  benzonatate (TESSALON) 200 MG capsule Take 1 capsule (200 mg total) by mouth 3 (three) times daily as needed for cough. 03/10/16  Yes Mariem Skolnick, PA-C  fluticasone (FLONASE) 50 MCG/ACT nasal spray Place 2 sprays into both nostrils at bedtime. 09/03/15  Yes Sherren MochaShaw, Eva N, MD  levocetirizine (XYZAL) 5 MG tablet Take 5 mg by mouth every evening.   Yes [provider]  norethindrone-ethinyl estradiol 1/35 (ORTHO-NOVUM, NORTREL,CYCLAFEM) tablet Take 1 tablet daily by mouth. take continuously, skip non-hormone containing tablets. 06/04/17  Yes Haven Foss, PA-C  Tiotropium Bromide Monohydrate (SPIRIVA RESPIMAT) 1.25 MCG/ACT AERS Inhale into the lungs.   Yes [provider]     Allergies  Allergen Reactions  . Latex   . Prednisone Other (See Comments)    Light headness, dizzy, mania (with inhaled steroid) and paranoia and hallucinations (oral steroids)       Objective:  Physical Exam  Constitutional: She is oriented to person, place, and time. She appears well-developed and well-nourished. She is active and cooperative. No distress.  BP 130/82   Pulse (!) 105   Temp 98.4 F (36.9 C) (Oral)   Resp 16   Ht 5' 5.75" (1.67 m)   Wt 251 lb (113.9 kg)  LMP 02/17/2017   SpO2 98%   BMI 40.82 kg/m   HENT:  Head: Normocephalic and atraumatic.  Right Ear: Hearing, tympanic membrane, external ear and ear canal normal.  Left Ear: Hearing, tympanic membrane, external ear and ear canal normal.  Nose: Mucosal edema present. No rhinorrhea. Right sinus exhibits frontal sinus tenderness. Right sinus exhibits no maxillary sinus tenderness. Left sinus exhibits frontal sinus tenderness. Left sinus exhibits no maxillary sinus  tenderness.  Mouth/Throat: Uvula is midline, oropharynx is clear and moist and mucous membranes are normal.  Eyes: Conjunctivae are normal. No scleral icterus.  Neck: Normal range of motion. Neck supple. No thyromegaly present.  Cardiovascular: Normal rate, regular rhythm and normal heart sounds.  Pulses:      Radial pulses are 2+ on the right side, and 2+ on the left side.  Pulmonary/Chest: Effort normal and breath sounds normal.  Lymphadenopathy:       Head (right side): No tonsillar, no preauricular, no posterior auricular and no occipital adenopathy present.       Head (left side): No tonsillar, no preauricular, no posterior auricular and no occipital adenopathy present.    She has no cervical adenopathy.       Right: No supraclavicular adenopathy present.       Left: No supraclavicular adenopathy present.  Neurological: She is alert and oriented to person, place, and time. No sensory deficit.  Skin: Skin is warm, dry and intact. No rash noted. No cyanosis or erythema. Nails show no clubbing.  Psychiatric: She has a normal mood and affect. Her speech is normal and behavior is normal.        Assessment & Plan:   Problem List Items Addressed This Visit    HYPERLIPIDEMIA    Await labs. Adjust regimen as indicated by results.      Relevant Orders   Comprehensive metabolic panel   Lipid panel   Hemoglobin A1c   Endometriosis - Primary    Well controlled with COC.      Ovarian cyst    Well controlled with COC.      Elevated TSH    Await labs. Initiate treatment as indicated by results.      Relevant Orders   TSH   T4, free    Other Visit Diagnoses    Screening for deficiency anemia       Relevant Orders   CBC with Differential/Platelet   Screening for blood or protein in urine       Relevant Orders   Urinalysis, dipstick only   Chronic sinusitis, unspecified location       Relevant Medications   clarithromycin (BIAXIN XL) 500 MG 24 hr tablet   azelastine  (ASTELIN) 0.1 % nasal spray       Return if symptoms worsen or fail to improve.   Fernande Brashelle S. Roxas Clymer, PA-C Primary Care at [facility]

## 2017-06-09 NOTE — Assessment & Plan Note (Signed)
Well controlled with COC. 

## 2017-06-09 NOTE — Patient Instructions (Addendum)
Look at these OBGYN physicians: Dr. Elsie LincolnKelly Leggett Dr. Jonette Evaonya Pratt Dr. Gertie ExonJill Jertson Dr. Jaynie CollinsUgonna Anyanwu    IF you received an x-ray today, you will receive an invoice from [facility]. Please contact [facility] at [phone] with questions or concerns regarding your invoice.   IF you received labwork today, you will receive an invoice from [facility]. Please contact [facility] at [phone] with questions or concerns regarding your invoice.   Our billing staff will not be able to assist you with questions regarding bills from these companies.  You will be contacted with the lab results as soon as they are available. The fastest way to get your results is to activate your My Chart account. Instructions are located on the last page of this paperwork. If you have not heard from us regarding the results in 2 weeks, please contact this office.

## 2017-06-09 NOTE — Addendum Note (Signed)
Addended by: Fernande BrasJEFFERY, Jasim Harari S on: 06/09/2017 04:37 PM   Modules accepted: Orders

## 2017-06-09 NOTE — Assessment & Plan Note (Signed)
Await labs. Initiate treatment as indicated by results.   

## 2017-06-09 NOTE — Assessment & Plan Note (Signed)
Well controlled with COC.

## 2017-06-09 NOTE — Assessment & Plan Note (Signed)
Await labs. Adjust regimen as indicated by results.  

## 2017-06-10 LAB — URINALYSIS, DIPSTICK ONLY
BILIRUBIN UA: NEGATIVE
Glucose, UA: NEGATIVE
Ketones, UA: NEGATIVE
Leukocytes, UA: NEGATIVE
NITRITE UA: NEGATIVE
PH UA: 5.5 (ref 5.0–7.5)
Protein, UA: NEGATIVE
RBC UA: NEGATIVE
Specific Gravity, UA: 1.022 (ref 1.005–1.030)
UUROB: 0.2 mg/dL (ref 0.2–1.0)

## 2017-06-12 LAB — CBC WITH DIFFERENTIAL/PLATELET
BASOS: 0 %
Basophils Absolute: 0 10*3/uL (ref 0.0–0.2)
EOS (ABSOLUTE): 0.2 10*3/uL (ref 0.0–0.4)
EOS: 2 %
HEMATOCRIT: 44.5 % (ref 34.0–46.6)
Hemoglobin: 14.3 g/dL (ref 11.1–15.9)
IMMATURE GRANS (ABS): 0 10*3/uL (ref 0.0–0.1)
IMMATURE GRANULOCYTES: 0 %
Lymphocytes Absolute: 2.3 10*3/uL (ref 0.7–3.1)
Lymphs: 21 %
MCH: 29 pg (ref 26.6–33.0)
MCHC: 32.1 g/dL (ref 31.5–35.7)
MCV: 90 fL (ref 79–97)
MONOS ABS: 0.4 10*3/uL (ref 0.1–0.9)
Monocytes: 4 %
NEUTROS PCT: 73 %
Neutrophils Absolute: 7.9 10*3/uL — ABNORMAL HIGH (ref 1.4–7.0)
Platelets: 531 10*3/uL — ABNORMAL HIGH (ref 150–379)
RBC: 4.93 x10E6/uL (ref 3.77–5.28)
RDW: 14 % (ref 12.3–15.4)
WBC: 10.9 10*3/uL — AB (ref 3.4–10.8)

## 2017-06-12 LAB — LIPID PANEL
CHOL/HDL RATIO: 4.2 ratio (ref 0.0–4.4)
Cholesterol, Total: 191 mg/dL (ref 100–199)
HDL: 46 mg/dL (ref >39)
LDL CALC: 113 mg/dL — AB (ref 0–99)
TRIGLYCERIDES: 162 mg/dL — AB (ref 0–149)
VLDL Cholesterol Cal: 32 mg/dL (ref 5–40)

## 2017-06-12 LAB — COMPREHENSIVE METABOLIC PANEL
A/G RATIO: 1.4 (ref 1.2–2.2)
ALT: 34 IU/L — ABNORMAL HIGH (ref 0–32)
AST: 26 IU/L (ref 0–40)
Albumin: 4.1 g/dL (ref 3.5–5.5)
Alkaline Phosphatase: 66 IU/L (ref 39–117)
BUN/Creatinine Ratio: 8 — ABNORMAL LOW (ref 9–23)
BUN: 6 mg/dL (ref 6–24)
Bilirubin Total: 0.2 mg/dL (ref 0.0–1.2)
CALCIUM: 9 mg/dL (ref 8.7–10.2)
CO2: 19 mmol/L — AB (ref 20–29)
CREATININE: 0.75 mg/dL (ref 0.57–1.00)
Chloride: 102 mmol/L (ref 96–106)
GFR, EST AFRICAN AMERICAN: 112 mL/min/{1.73_m2} (ref >59)
GFR, EST NON AFRICAN AMERICAN: 97 mL/min/{1.73_m2} (ref >59)
GLOBULIN, TOTAL: 2.9 g/dL (ref 1.5–4.5)
Glucose: 208 mg/dL — ABNORMAL HIGH (ref 65–99)
POTASSIUM: 3.9 mmol/L (ref 3.5–5.2)
SODIUM: 140 mmol/L (ref 134–144)
TOTAL PROTEIN: 7 g/dL (ref 6.0–8.5)

## 2017-06-12 LAB — HEMOGLOBIN A1C
ESTIMATED AVERAGE GLUCOSE: 134 mg/dL
Hgb A1c MFr Bld: 6.3 % — ABNORMAL HIGH (ref 4.8–5.6)

## 2017-06-12 LAB — T4, FREE: Free T4: 0.88 ng/dL (ref 0.82–1.77)

## 2017-06-12 LAB — TSH: TSH: 5.3 u[IU]/mL — AB (ref 0.450–4.500)

## 2017-06-13 ENCOUNTER — Encounter: Payer: Self-pay | Admitting: Physician Assistant

## 2017-06-13 DIAGNOSIS — R7303 Prediabetes: Secondary | ICD-10-CM | POA: Insufficient documentation

## 2017-06-22 DIAGNOSIS — J3089 Other allergic rhinitis: Secondary | ICD-10-CM | POA: Diagnosis not present

## 2017-06-22 DIAGNOSIS — J301 Allergic rhinitis due to pollen: Secondary | ICD-10-CM | POA: Diagnosis not present

## 2017-06-22 DIAGNOSIS — J3081 Allergic rhinitis due to animal (cat) (dog) hair and dander: Secondary | ICD-10-CM | POA: Diagnosis not present

## 2017-06-24 ENCOUNTER — Encounter: Payer: Self-pay | Admitting: Physician Assistant

## 2017-07-08 MED FILL — LEVOCETIRIZINE 5 MG TABLET: 5 | 30 days supply | Qty: 30 | Fill #4

## 2017-07-08 MED FILL — FLUTICASONE PROP 50 MCG SPR: 50 | 60 days supply | Qty: 16 | Fill #1

## 2017-07-09 DIAGNOSIS — J301 Allergic rhinitis due to pollen: Secondary | ICD-10-CM | POA: Diagnosis not present

## 2017-07-09 DIAGNOSIS — J3089 Other allergic rhinitis: Secondary | ICD-10-CM | POA: Diagnosis not present

## 2017-07-09 DIAGNOSIS — J3081 Allergic rhinitis due to animal (cat) (dog) hair and dander: Secondary | ICD-10-CM | POA: Diagnosis not present

## 2017-07-16 NOTE — Telephone Encounter (Signed)
Error-Sign Encounter 

## 2017-07-21 DIAGNOSIS — J3089 Other allergic rhinitis: Secondary | ICD-10-CM | POA: Diagnosis not present

## 2017-07-21 DIAGNOSIS — J3081 Allergic rhinitis due to animal (cat) (dog) hair and dander: Secondary | ICD-10-CM | POA: Diagnosis not present

## 2017-07-21 DIAGNOSIS — J301 Allergic rhinitis due to pollen: Secondary | ICD-10-CM | POA: Diagnosis not present

## 2017-07-28 DIAGNOSIS — J301 Allergic rhinitis due to pollen: Secondary | ICD-10-CM | POA: Diagnosis not present

## 2017-07-28 DIAGNOSIS — J3081 Allergic rhinitis due to animal (cat) (dog) hair and dander: Secondary | ICD-10-CM | POA: Diagnosis not present

## 2017-07-28 DIAGNOSIS — J3089 Other allergic rhinitis: Secondary | ICD-10-CM | POA: Diagnosis not present

## 2017-08-04 DIAGNOSIS — J301 Allergic rhinitis due to pollen: Secondary | ICD-10-CM | POA: Diagnosis not present

## 2017-08-04 DIAGNOSIS — J3081 Allergic rhinitis due to animal (cat) (dog) hair and dander: Secondary | ICD-10-CM | POA: Diagnosis not present

## 2017-08-04 DIAGNOSIS — J3089 Other allergic rhinitis: Secondary | ICD-10-CM | POA: Diagnosis not present

## 2017-08-16 DIAGNOSIS — J301 Allergic rhinitis due to pollen: Secondary | ICD-10-CM | POA: Diagnosis not present

## 2017-08-16 DIAGNOSIS — J3089 Other allergic rhinitis: Secondary | ICD-10-CM | POA: Diagnosis not present

## 2017-08-16 DIAGNOSIS — J3081 Allergic rhinitis due to animal (cat) (dog) hair and dander: Secondary | ICD-10-CM | POA: Diagnosis not present

## 2017-09-13 DIAGNOSIS — J3089 Other allergic rhinitis: Secondary | ICD-10-CM | POA: Diagnosis not present

## 2017-09-13 DIAGNOSIS — H1045 Other chronic allergic conjunctivitis: Secondary | ICD-10-CM | POA: Diagnosis not present

## 2017-09-13 DIAGNOSIS — J4540 Moderate persistent asthma, uncomplicated: Secondary | ICD-10-CM | POA: Diagnosis not present

## 2017-09-13 DIAGNOSIS — J301 Allergic rhinitis due to pollen: Secondary | ICD-10-CM | POA: Diagnosis not present

## 2017-09-13 MED FILL — SPIRIVA RESPIMAT 1.25 MCG I: 1.25 | 30 days supply | Qty: 4 | Fill #1

## 2017-09-13 MED FILL — SULFAMETHOXAZOLE-TMP DS TAB: 800-160 | 10 days supply | Qty: 20 | Fill #0

## 2017-09-13 MED FILL — ALYACEN 1-35-28 TABLET: 1-35 | 84 days supply | Qty: 112 | Fill #1

## 2017-09-13 MED FILL — ARNUITY ELLIPTA 100 MCG INH: 100 | 30 days supply | Qty: 30 | Fill #1

## 2017-09-13 MED FILL — LEVOCETIRIZINE 5 MG TABLET: 5 | 30 days supply | Qty: 30 | Fill #0

## 2017-10-08 DIAGNOSIS — J3081 Allergic rhinitis due to animal (cat) (dog) hair and dander: Secondary | ICD-10-CM | POA: Diagnosis not present

## 2017-10-08 DIAGNOSIS — J3089 Other allergic rhinitis: Secondary | ICD-10-CM | POA: Diagnosis not present

## 2017-10-08 DIAGNOSIS — J301 Allergic rhinitis due to pollen: Secondary | ICD-10-CM | POA: Diagnosis not present

## 2017-10-11 DIAGNOSIS — J301 Allergic rhinitis due to pollen: Secondary | ICD-10-CM | POA: Diagnosis not present

## 2017-10-11 DIAGNOSIS — J3089 Other allergic rhinitis: Secondary | ICD-10-CM | POA: Diagnosis not present

## 2017-10-11 DIAGNOSIS — J3081 Allergic rhinitis due to animal (cat) (dog) hair and dander: Secondary | ICD-10-CM | POA: Diagnosis not present

## 2017-10-13 DIAGNOSIS — J301 Allergic rhinitis due to pollen: Secondary | ICD-10-CM | POA: Diagnosis not present

## 2017-10-13 DIAGNOSIS — J3081 Allergic rhinitis due to animal (cat) (dog) hair and dander: Secondary | ICD-10-CM | POA: Diagnosis not present

## 2017-10-13 DIAGNOSIS — J3089 Other allergic rhinitis: Secondary | ICD-10-CM | POA: Diagnosis not present

## 2017-10-14 DIAGNOSIS — J3089 Other allergic rhinitis: Secondary | ICD-10-CM | POA: Diagnosis not present

## 2017-10-20 DIAGNOSIS — J3089 Other allergic rhinitis: Secondary | ICD-10-CM | POA: Diagnosis not present

## 2017-10-20 DIAGNOSIS — J3081 Allergic rhinitis due to animal (cat) (dog) hair and dander: Secondary | ICD-10-CM | POA: Diagnosis not present

## 2017-10-20 DIAGNOSIS — J301 Allergic rhinitis due to pollen: Secondary | ICD-10-CM | POA: Diagnosis not present

## 2017-10-21 ENCOUNTER — Encounter: Payer: Self-pay | Admitting: Physician Assistant

## 2017-10-23 ENCOUNTER — Telehealth: Payer: Self-pay | Admitting: Physician Assistant

## 2017-10-23 DIAGNOSIS — J3089 Other allergic rhinitis: Secondary | ICD-10-CM | POA: Diagnosis not present

## 2017-10-23 DIAGNOSIS — R05 Cough: Secondary | ICD-10-CM | POA: Diagnosis not present

## 2017-10-23 NOTE — Telephone Encounter (Signed)
Pt came in to try and schedule an appointment for Saturday 10/23/17 but we did not have any openings. Pt states she believes she has a sinus infection and scheduled an appointment for Monday 10/25/17 with Nancy Franklin since Nancy Franklin is not here and Dr. Clelia Franklin is full. Pt was wondering if there was anything that could be called in for her over the weekend since she is going to have to wait 2 days to be seen and in the past, this has turned into bronchitis and then double pneumonia. Please advise.

## 2017-10-23 NOTE — Telephone Encounter (Signed)
L/m for pt re her request for provider to call in Rx for sinus infection.  Advised in message that best option would be to keep appt for Monday so that she could be evaluated appropriately.  Pt hasn't been seen for this c/o recently and could take provider up to two business days to respond to request.

## 2017-10-25 ENCOUNTER — Ambulatory Visit: Payer: BLUE CROSS/BLUE SHIELD | Admitting: Physician Assistant

## 2017-10-25 VITALS — BP 130/85 | HR 88 | Temp 98.3°F | Resp 16 | Ht 65.0 in | Wt 241.0 lb

## 2017-10-25 DIAGNOSIS — R059 Cough, unspecified: Secondary | ICD-10-CM

## 2017-10-25 DIAGNOSIS — J069 Acute upper respiratory infection, unspecified: Secondary | ICD-10-CM

## 2017-10-25 DIAGNOSIS — R05 Cough: Secondary | ICD-10-CM | POA: Diagnosis not present

## 2017-10-25 MED ORDER — BENZONATATE 200 MG PO CAPS: 200.0000 mg | ORAL_CAPSULE | Freq: Two times a day (BID) | ORAL | 0 refills | Status: AC | PRN

## 2017-10-25 MED ORDER — HYDROCODONE-HOMATROPINE 5-1.5 MG/5ML PO SYRP
2.5000 mL | ORAL_SOLUTION | Freq: Four times a day (QID) | ORAL | 0 refills | Status: AC | PRN
Start: 2017-10-25 — End: 2017-10-30

## 2017-10-25 MED ORDER — DOXYCYCLINE HYCLATE 100 MG PO CAPS: 100.0000 mg | ORAL_CAPSULE | Freq: Two times a day (BID) | ORAL | 0 refills | Status: AC

## 2017-10-25 MED FILL — HYDROCODONE-HOMATROPINE SYR: 5-1.5 | 3 days supply | Qty: 60 | Fill #0

## 2017-10-25 MED FILL — DOXYCYCLINE HYCLATE 100 MG: 100 | 10 days supply | Qty: 20 | Fill #0

## 2017-10-25 MED FILL — BENZONATATE 200 MG CAPS: 200 | 10 days supply | Qty: 20 | Fill #0

## 2017-10-25 NOTE — Patient Instructions (Addendum)
Hold until day ten unless fever 100.4 or greater and consistent.      IF you received an x-ray today, you will receive an invoice from [facility]. Please contact [facility] at [phone] with questions or concerns regarding your invoice.   IF you received labwork today, you will receive an invoice from [facility]. Please contact [facility] at [phone] with questions or concerns regarding your invoice.   Our billing staff will not be able to assist you with questions regarding bills from these companies.  You will be contacted with the lab results as soon as they are available. The fastest way to get your results is to activate your My Chart account. Instructions are located on the last page of this paperwork. If you have not heard from us regarding the results in 2 weeks, please contact this office.

## 2017-10-25 NOTE — Progress Notes (Signed)
10/25/2017 9:43 AM   DOB: 04/14/1971 / MRN: [id number]  SUBJECTIVE:  Nancy Franklin is a 47 y.o. female presenting for nasal congestion, cough.  Symptoms present for 5 days.  Patient tells me she has a history of bronchitis and pneumonia.  She is being seen by allergy at this time and is taking Xyzal, Claritin along with Flonase none of which are managing her current symptoms.  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  Associated subjective fever but denies shortness of breath, rigor per HPI, leg swelling.     She is allergic to latex and prednisone.   She  has a past medical history of Allergy, Endometriosis, and Ovarian cyst.    She  reports that she has never smoked. She has never used smokeless tobacco. She reports that she does not drink alcohol or use drugs. She  reports that she currently engages in sexual activity and has had partners who are Female. She reports using the following method of birth control/protection: Pill. The patient  has a past surgical history that includes Laparoscopic endometriosis fulguration (1997).  Her family history includes Arthritis in her mother; Diabetes in her mother; Heart disease in her paternal grandfather; Stroke in her mother.  ROS per HPI  The problem list and medications were reviewed and updated by myself where necessary and exist elsewhere in the encounter.   OBJECTIVE:  BP 130/85   Pulse 88   Temp 98.3 F (36.8 C) (Oral)   Resp 16   Ht 5\' 5"  (1.651 m)   Wt 241 lb (109.3 kg)   SpO2 96%   BMI 40.10 kg/m   Physical Exam  Constitutional: She is active.  Non-toxic appearance.  HENT:  Right Ear: Hearing, tympanic membrane, external ear and ear canal normal.  Left Ear: Hearing, tympanic membrane, external ear and ear canal normal.  Nose: Nose normal. Right sinus exhibits no maxillary sinus tenderness and no frontal sinus tenderness. Left sinus exhibits no maxillary sinus tenderness and no frontal sinus tenderness.  Mouth/Throat:  Uvula is midline, oropharynx is clear and moist and mucous membranes are normal. Mucous membranes are not dry. No oropharyngeal exudate, posterior oropharyngeal edema or tonsillar abscesses.  Cardiovascular: Normal rate, regular rhythm, S1 normal, S2 normal, normal heart sounds and intact distal pulses. Exam reveals no gallop, no friction rub and no decreased pulses.  No murmur heard. Pulmonary/Chest: Effort normal. No stridor. No tachypnea. No respiratory distress. She has no wheezes. She has no rales.  Abdominal: She exhibits no distension.  Musculoskeletal: She exhibits no edema.  Lymphadenopathy:       Head (right side): No submandibular and no tonsillar adenopathy present.       Head (left side): No submandibular and no tonsillar adenopathy present.    She has no cervical adenopathy.  Neurological: She is alert.  Skin: Skin is warm and dry. She is not diaphoretic. No pallor.    No results found for this or any previous visit (from the past 72 hour(s)).  No results found.  ASSESSMENT AND PLAN:  Nancy Landngela was seen today for sinusitis and cough.  Diagnoses and all orders for this visit:  Acute URI: Viral versus bacterial.  Most likely the former however patient tells me she has a history of "pneumonia."  I have advised that she hold the doxycycline for 5 days and fill if she continues to not improve.  If she gets fever greater than 100.4 advise she can fill.  She was asking  for Septra however this is not the best choice for community-acquired pneumonia.  I provided her with Doxy  Cough -     doxycycline (VIBRAMYCIN) 100 MG capsule; Take 1 capsule (100 mg total) by mouth 2 (two) times daily for 10 days. -     benzonatate (TESSALON) 200 MG capsule; Take 1 capsule (200 mg total) by mouth 2 (two) times daily as needed for cough. -     HYDROcodone-homatropine (HYCODAN) 5-1.5 MG/5ML syrup; Take 2.5-5 mLs by mouth every 6 (six) hours as needed for up to 5 days for cough.    The patient is  advised to call or return to clinic if she does not see an improvement in symptoms, or to seek the care of the closest emergency department if she worsens with the above plan.   Deliah Boston, MHS, PA-C Primary Care at [facility] 10/25/2017 9:43 AM

## 2017-11-03 DIAGNOSIS — J3081 Allergic rhinitis due to animal (cat) (dog) hair and dander: Secondary | ICD-10-CM | POA: Diagnosis not present

## 2017-11-03 DIAGNOSIS — J3089 Other allergic rhinitis: Secondary | ICD-10-CM | POA: Diagnosis not present

## 2017-11-03 DIAGNOSIS — J301 Allergic rhinitis due to pollen: Secondary | ICD-10-CM | POA: Diagnosis not present

## 2017-11-09 DIAGNOSIS — J3089 Other allergic rhinitis: Secondary | ICD-10-CM | POA: Diagnosis not present

## 2017-11-09 DIAGNOSIS — J3081 Allergic rhinitis due to animal (cat) (dog) hair and dander: Secondary | ICD-10-CM | POA: Diagnosis not present

## 2017-11-09 DIAGNOSIS — J301 Allergic rhinitis due to pollen: Secondary | ICD-10-CM | POA: Diagnosis not present

## 2017-11-11 DIAGNOSIS — J301 Allergic rhinitis due to pollen: Secondary | ICD-10-CM | POA: Diagnosis not present

## 2017-11-11 DIAGNOSIS — J3081 Allergic rhinitis due to animal (cat) (dog) hair and dander: Secondary | ICD-10-CM | POA: Diagnosis not present

## 2017-11-11 DIAGNOSIS — J3089 Other allergic rhinitis: Secondary | ICD-10-CM | POA: Diagnosis not present

## 2017-11-17 DIAGNOSIS — J301 Allergic rhinitis due to pollen: Secondary | ICD-10-CM | POA: Diagnosis not present

## 2017-11-17 DIAGNOSIS — J3089 Other allergic rhinitis: Secondary | ICD-10-CM | POA: Diagnosis not present

## 2017-11-17 DIAGNOSIS — J3081 Allergic rhinitis due to animal (cat) (dog) hair and dander: Secondary | ICD-10-CM | POA: Diagnosis not present

## 2017-11-19 DIAGNOSIS — J301 Allergic rhinitis due to pollen: Secondary | ICD-10-CM | POA: Diagnosis not present

## 2017-11-19 DIAGNOSIS — J3089 Other allergic rhinitis: Secondary | ICD-10-CM | POA: Diagnosis not present

## 2017-11-19 DIAGNOSIS — J3081 Allergic rhinitis due to animal (cat) (dog) hair and dander: Secondary | ICD-10-CM | POA: Diagnosis not present

## 2017-11-23 DIAGNOSIS — J3081 Allergic rhinitis due to animal (cat) (dog) hair and dander: Secondary | ICD-10-CM | POA: Diagnosis not present

## 2017-11-23 DIAGNOSIS — J301 Allergic rhinitis due to pollen: Secondary | ICD-10-CM | POA: Diagnosis not present

## 2017-11-23 DIAGNOSIS — J3089 Other allergic rhinitis: Secondary | ICD-10-CM | POA: Diagnosis not present

## 2017-12-01 DIAGNOSIS — J3089 Other allergic rhinitis: Secondary | ICD-10-CM | POA: Diagnosis not present

## 2017-12-01 DIAGNOSIS — J301 Allergic rhinitis due to pollen: Secondary | ICD-10-CM | POA: Diagnosis not present

## 2017-12-01 DIAGNOSIS — J3081 Allergic rhinitis due to animal (cat) (dog) hair and dander: Secondary | ICD-10-CM | POA: Diagnosis not present

## 2017-12-08 DIAGNOSIS — J301 Allergic rhinitis due to pollen: Secondary | ICD-10-CM | POA: Diagnosis not present

## 2017-12-08 DIAGNOSIS — J3089 Other allergic rhinitis: Secondary | ICD-10-CM | POA: Diagnosis not present

## 2017-12-08 DIAGNOSIS — J3081 Allergic rhinitis due to animal (cat) (dog) hair and dander: Secondary | ICD-10-CM | POA: Diagnosis not present

## 2017-12-16 DIAGNOSIS — J3081 Allergic rhinitis due to animal (cat) (dog) hair and dander: Secondary | ICD-10-CM | POA: Diagnosis not present

## 2017-12-16 DIAGNOSIS — J3089 Other allergic rhinitis: Secondary | ICD-10-CM | POA: Diagnosis not present

## 2017-12-16 DIAGNOSIS — J301 Allergic rhinitis due to pollen: Secondary | ICD-10-CM | POA: Diagnosis not present

## 2017-12-17 MED FILL — SPIRIVA RESPIMAT 1.25 MCG I: 1.25 | 30 days supply | Qty: 4 | Fill #2

## 2017-12-17 MED FILL — ARNUITY ELLIPTA 100 MCG INH: 100 | 30 days supply | Qty: 30 | Fill #2

## 2017-12-17 MED FILL — ALYACEN 1-35-28 TABLET: 1-35 | 84 days supply | Qty: 112 | Fill #2

## 2017-12-21 DIAGNOSIS — J301 Allergic rhinitis due to pollen: Secondary | ICD-10-CM | POA: Diagnosis not present

## 2017-12-21 DIAGNOSIS — J3081 Allergic rhinitis due to animal (cat) (dog) hair and dander: Secondary | ICD-10-CM | POA: Diagnosis not present

## 2017-12-21 DIAGNOSIS — J3089 Other allergic rhinitis: Secondary | ICD-10-CM | POA: Diagnosis not present

## 2017-12-30 DIAGNOSIS — J3081 Allergic rhinitis due to animal (cat) (dog) hair and dander: Secondary | ICD-10-CM | POA: Diagnosis not present

## 2017-12-30 DIAGNOSIS — J301 Allergic rhinitis due to pollen: Secondary | ICD-10-CM | POA: Diagnosis not present

## 2017-12-30 DIAGNOSIS — J3089 Other allergic rhinitis: Secondary | ICD-10-CM | POA: Diagnosis not present

## 2018-01-03 DIAGNOSIS — J3089 Other allergic rhinitis: Secondary | ICD-10-CM | POA: Diagnosis not present

## 2018-01-03 DIAGNOSIS — J301 Allergic rhinitis due to pollen: Secondary | ICD-10-CM | POA: Diagnosis not present

## 2018-01-03 DIAGNOSIS — J3081 Allergic rhinitis due to animal (cat) (dog) hair and dander: Secondary | ICD-10-CM | POA: Diagnosis not present

## 2018-01-05 DIAGNOSIS — J301 Allergic rhinitis due to pollen: Secondary | ICD-10-CM | POA: Diagnosis not present

## 2018-01-05 DIAGNOSIS — J3081 Allergic rhinitis due to animal (cat) (dog) hair and dander: Secondary | ICD-10-CM | POA: Diagnosis not present

## 2018-01-12 DIAGNOSIS — J301 Allergic rhinitis due to pollen: Secondary | ICD-10-CM | POA: Diagnosis not present

## 2018-01-12 DIAGNOSIS — J3089 Other allergic rhinitis: Secondary | ICD-10-CM | POA: Diagnosis not present

## 2018-01-12 DIAGNOSIS — J3081 Allergic rhinitis due to animal (cat) (dog) hair and dander: Secondary | ICD-10-CM | POA: Diagnosis not present

## 2018-01-18 DIAGNOSIS — J301 Allergic rhinitis due to pollen: Secondary | ICD-10-CM | POA: Diagnosis not present

## 2018-01-18 DIAGNOSIS — J3089 Other allergic rhinitis: Secondary | ICD-10-CM | POA: Diagnosis not present

## 2018-01-18 DIAGNOSIS — J3081 Allergic rhinitis due to animal (cat) (dog) hair and dander: Secondary | ICD-10-CM | POA: Diagnosis not present

## 2018-01-26 DIAGNOSIS — J301 Allergic rhinitis due to pollen: Secondary | ICD-10-CM | POA: Diagnosis not present

## 2018-01-26 DIAGNOSIS — J3089 Other allergic rhinitis: Secondary | ICD-10-CM | POA: Diagnosis not present

## 2018-01-26 DIAGNOSIS — J3081 Allergic rhinitis due to animal (cat) (dog) hair and dander: Secondary | ICD-10-CM | POA: Diagnosis not present

## 2018-02-01 DIAGNOSIS — J301 Allergic rhinitis due to pollen: Secondary | ICD-10-CM | POA: Diagnosis not present

## 2018-02-01 DIAGNOSIS — J3089 Other allergic rhinitis: Secondary | ICD-10-CM | POA: Diagnosis not present

## 2018-02-01 DIAGNOSIS — J3081 Allergic rhinitis due to animal (cat) (dog) hair and dander: Secondary | ICD-10-CM | POA: Diagnosis not present

## 2018-02-09 DIAGNOSIS — J301 Allergic rhinitis due to pollen: Secondary | ICD-10-CM | POA: Diagnosis not present

## 2018-02-09 DIAGNOSIS — J3089 Other allergic rhinitis: Secondary | ICD-10-CM | POA: Diagnosis not present

## 2018-02-09 DIAGNOSIS — J3081 Allergic rhinitis due to animal (cat) (dog) hair and dander: Secondary | ICD-10-CM | POA: Diagnosis not present

## 2018-02-16 DIAGNOSIS — J3081 Allergic rhinitis due to animal (cat) (dog) hair and dander: Secondary | ICD-10-CM | POA: Diagnosis not present

## 2018-02-16 DIAGNOSIS — J3089 Other allergic rhinitis: Secondary | ICD-10-CM | POA: Diagnosis not present

## 2018-02-16 DIAGNOSIS — J301 Allergic rhinitis due to pollen: Secondary | ICD-10-CM | POA: Diagnosis not present

## 2018-02-21 DIAGNOSIS — J3081 Allergic rhinitis due to animal (cat) (dog) hair and dander: Secondary | ICD-10-CM | POA: Diagnosis not present

## 2018-02-21 DIAGNOSIS — J3089 Other allergic rhinitis: Secondary | ICD-10-CM | POA: Diagnosis not present

## 2018-02-21 DIAGNOSIS — J301 Allergic rhinitis due to pollen: Secondary | ICD-10-CM | POA: Diagnosis not present

## 2018-03-02 DIAGNOSIS — J301 Allergic rhinitis due to pollen: Secondary | ICD-10-CM | POA: Diagnosis not present

## 2018-03-02 DIAGNOSIS — J3089 Other allergic rhinitis: Secondary | ICD-10-CM | POA: Diagnosis not present

## 2018-03-02 DIAGNOSIS — J3081 Allergic rhinitis due to animal (cat) (dog) hair and dander: Secondary | ICD-10-CM | POA: Diagnosis not present

## 2018-03-07 DIAGNOSIS — J3081 Allergic rhinitis due to animal (cat) (dog) hair and dander: Secondary | ICD-10-CM | POA: Diagnosis not present

## 2018-03-07 DIAGNOSIS — J301 Allergic rhinitis due to pollen: Secondary | ICD-10-CM | POA: Diagnosis not present

## 2018-03-07 DIAGNOSIS — J3089 Other allergic rhinitis: Secondary | ICD-10-CM | POA: Diagnosis not present

## 2018-03-16 DIAGNOSIS — J3081 Allergic rhinitis due to animal (cat) (dog) hair and dander: Secondary | ICD-10-CM | POA: Diagnosis not present

## 2018-03-16 DIAGNOSIS — J3089 Other allergic rhinitis: Secondary | ICD-10-CM | POA: Diagnosis not present

## 2018-03-16 DIAGNOSIS — J301 Allergic rhinitis due to pollen: Secondary | ICD-10-CM | POA: Diagnosis not present

## 2018-03-28 MED FILL — ALYACEN 1-35-28 TABLET: 1-35 | 84 days supply | Qty: 112 | Fill #3

## 2018-04-01 DIAGNOSIS — H1045 Other chronic allergic conjunctivitis: Secondary | ICD-10-CM | POA: Diagnosis not present

## 2018-04-01 DIAGNOSIS — J301 Allergic rhinitis due to pollen: Secondary | ICD-10-CM | POA: Diagnosis not present

## 2018-04-01 DIAGNOSIS — J3089 Other allergic rhinitis: Secondary | ICD-10-CM | POA: Diagnosis not present

## 2018-04-01 DIAGNOSIS — J4540 Moderate persistent asthma, uncomplicated: Secondary | ICD-10-CM | POA: Diagnosis not present

## 2018-04-01 MED FILL — DOXYCYCLINE HYCLATE 100 MG: 100 | 10 days supply | Qty: 20 | Fill #0

## 2018-04-01 MED FILL — ARNUITY ELLIPTA 100 MCG INH: 100 | 30 days supply | Qty: 30 | Fill #0

## 2018-04-05 DIAGNOSIS — J301 Allergic rhinitis due to pollen: Secondary | ICD-10-CM | POA: Diagnosis not present

## 2018-04-05 DIAGNOSIS — J3081 Allergic rhinitis due to animal (cat) (dog) hair and dander: Secondary | ICD-10-CM | POA: Diagnosis not present

## 2018-04-05 DIAGNOSIS — J3089 Other allergic rhinitis: Secondary | ICD-10-CM | POA: Diagnosis not present

## 2018-04-07 DIAGNOSIS — J3081 Allergic rhinitis due to animal (cat) (dog) hair and dander: Secondary | ICD-10-CM | POA: Diagnosis not present

## 2018-04-07 DIAGNOSIS — J301 Allergic rhinitis due to pollen: Secondary | ICD-10-CM | POA: Diagnosis not present

## 2018-04-08 DIAGNOSIS — J3089 Other allergic rhinitis: Secondary | ICD-10-CM | POA: Diagnosis not present

## 2018-04-12 DIAGNOSIS — J301 Allergic rhinitis due to pollen: Secondary | ICD-10-CM | POA: Diagnosis not present

## 2018-04-12 DIAGNOSIS — J3089 Other allergic rhinitis: Secondary | ICD-10-CM | POA: Diagnosis not present

## 2018-04-12 DIAGNOSIS — J3081 Allergic rhinitis due to animal (cat) (dog) hair and dander: Secondary | ICD-10-CM | POA: Diagnosis not present

## 2018-04-18 DIAGNOSIS — J3089 Other allergic rhinitis: Secondary | ICD-10-CM | POA: Diagnosis not present

## 2018-04-18 DIAGNOSIS — J301 Allergic rhinitis due to pollen: Secondary | ICD-10-CM | POA: Diagnosis not present

## 2018-04-18 DIAGNOSIS — J3081 Allergic rhinitis due to animal (cat) (dog) hair and dander: Secondary | ICD-10-CM | POA: Diagnosis not present

## 2018-04-20 DIAGNOSIS — J3081 Allergic rhinitis due to animal (cat) (dog) hair and dander: Secondary | ICD-10-CM | POA: Diagnosis not present

## 2018-04-20 DIAGNOSIS — J3089 Other allergic rhinitis: Secondary | ICD-10-CM | POA: Diagnosis not present

## 2018-04-20 DIAGNOSIS — J301 Allergic rhinitis due to pollen: Secondary | ICD-10-CM | POA: Diagnosis not present

## 2018-04-25 DIAGNOSIS — J3081 Allergic rhinitis due to animal (cat) (dog) hair and dander: Secondary | ICD-10-CM | POA: Diagnosis not present

## 2018-04-25 DIAGNOSIS — J301 Allergic rhinitis due to pollen: Secondary | ICD-10-CM | POA: Diagnosis not present

## 2018-04-25 DIAGNOSIS — J3089 Other allergic rhinitis: Secondary | ICD-10-CM | POA: Diagnosis not present

## 2018-04-27 DIAGNOSIS — J3089 Other allergic rhinitis: Secondary | ICD-10-CM | POA: Diagnosis not present

## 2018-04-28 MED FILL — ARNUITY ELLIPTA 100 MCG INH: 100 | 30 days supply | Qty: 30 | Fill #1

## 2018-05-02 DIAGNOSIS — J3081 Allergic rhinitis due to animal (cat) (dog) hair and dander: Secondary | ICD-10-CM | POA: Diagnosis not present

## 2018-05-02 DIAGNOSIS — J301 Allergic rhinitis due to pollen: Secondary | ICD-10-CM | POA: Diagnosis not present

## 2018-05-02 DIAGNOSIS — J3089 Other allergic rhinitis: Secondary | ICD-10-CM | POA: Diagnosis not present

## 2018-05-30 DIAGNOSIS — J301 Allergic rhinitis due to pollen: Secondary | ICD-10-CM | POA: Diagnosis not present

## 2018-05-30 DIAGNOSIS — J3089 Other allergic rhinitis: Secondary | ICD-10-CM | POA: Diagnosis not present

## 2018-05-30 DIAGNOSIS — J3081 Allergic rhinitis due to animal (cat) (dog) hair and dander: Secondary | ICD-10-CM | POA: Diagnosis not present

## 2018-06-06 DIAGNOSIS — J301 Allergic rhinitis due to pollen: Secondary | ICD-10-CM | POA: Diagnosis not present

## 2018-06-06 DIAGNOSIS — J3089 Other allergic rhinitis: Secondary | ICD-10-CM | POA: Diagnosis not present

## 2018-06-06 DIAGNOSIS — J3081 Allergic rhinitis due to animal (cat) (dog) hair and dander: Secondary | ICD-10-CM | POA: Diagnosis not present

## 2018-06-09 DIAGNOSIS — J3081 Allergic rhinitis due to animal (cat) (dog) hair and dander: Secondary | ICD-10-CM | POA: Diagnosis not present

## 2018-06-09 DIAGNOSIS — J301 Allergic rhinitis due to pollen: Secondary | ICD-10-CM | POA: Diagnosis not present

## 2018-06-13 DIAGNOSIS — J3081 Allergic rhinitis due to animal (cat) (dog) hair and dander: Secondary | ICD-10-CM | POA: Diagnosis not present

## 2018-06-13 DIAGNOSIS — J3089 Other allergic rhinitis: Secondary | ICD-10-CM | POA: Diagnosis not present

## 2018-06-13 DIAGNOSIS — J301 Allergic rhinitis due to pollen: Secondary | ICD-10-CM | POA: Diagnosis not present

## 2018-06-21 DIAGNOSIS — R05 Cough: Secondary | ICD-10-CM | POA: Diagnosis not present

## 2018-06-21 DIAGNOSIS — J329 Chronic sinusitis, unspecified: Secondary | ICD-10-CM | POA: Diagnosis not present

## 2018-06-21 MED FILL — DOXYCYCLINE MONOHYDRATE 100: 100 | 10 days supply | Qty: 20 | Fill #0

## 2018-06-21 MED FILL — HYDROCODONE-CHLORPHEN ER SU: 10-8 | 5 days supply | Qty: 50 | Fill #0

## 2018-06-29 MED FILL — ALYACEN 1-35-28 TABLET: 1-35 | 84 days supply | Qty: 112 | Fill #0

## 2018-06-30 MED FILL — ARNUITY ELLIPTA 100 MCG INH: 100 | 30 days supply | Qty: 30 | Fill #2

## 2018-07-05 DIAGNOSIS — J3089 Other allergic rhinitis: Secondary | ICD-10-CM | POA: Diagnosis not present

## 2018-07-05 DIAGNOSIS — Z23 Encounter for immunization: Secondary | ICD-10-CM | POA: Diagnosis not present

## 2018-07-05 DIAGNOSIS — J3081 Allergic rhinitis due to animal (cat) (dog) hair and dander: Secondary | ICD-10-CM | POA: Diagnosis not present

## 2018-07-05 DIAGNOSIS — J45991 Cough variant asthma: Secondary | ICD-10-CM | POA: Diagnosis not present

## 2018-07-05 DIAGNOSIS — J301 Allergic rhinitis due to pollen: Secondary | ICD-10-CM | POA: Diagnosis not present

## 2018-07-05 MED FILL — VENTOLIN HFA 90 MCG INHALER: 108 (90 BAS | 30 days supply | Qty: 18 | Fill #0

## 2018-07-07 DIAGNOSIS — J301 Allergic rhinitis due to pollen: Secondary | ICD-10-CM | POA: Diagnosis not present

## 2018-07-07 DIAGNOSIS — J3081 Allergic rhinitis due to animal (cat) (dog) hair and dander: Secondary | ICD-10-CM | POA: Diagnosis not present

## 2018-07-14 ENCOUNTER — Other Ambulatory Visit (HOSPITAL_COMMUNITY)
Admission: RE | Admit: 2018-07-14 | Discharge: 2018-07-14 | Disposition: A | Discharge status: Home / Self Care | Payer: BLUE CROSS/BLUE SHIELD | Source: Ambulatory Visit | Attending: Obstetrics and Gynecology | Admitting: Obstetrics and Gynecology

## 2018-07-14 ENCOUNTER — Ambulatory Visit: Payer: BLUE CROSS/BLUE SHIELD | Admitting: Obstetrics and Gynecology

## 2018-07-14 ENCOUNTER — Other Ambulatory Visit: Payer: Self-pay

## 2018-07-14 ENCOUNTER — Encounter: Payer: Self-pay | Admitting: Obstetrics and Gynecology

## 2018-07-14 VITALS — BP 122/64 | HR 64 | Ht 63.5 in | Wt 245.0 lb

## 2018-07-14 DIAGNOSIS — J3081 Allergic rhinitis due to animal (cat) (dog) hair and dander: Secondary | ICD-10-CM | POA: Diagnosis not present

## 2018-07-14 DIAGNOSIS — N898 Other specified noninflammatory disorders of vagina: Secondary | ICD-10-CM

## 2018-07-14 DIAGNOSIS — Z8742 Personal history of other diseases of the female genital tract: Secondary | ICD-10-CM

## 2018-07-14 DIAGNOSIS — Z124 Encounter for screening for malignant neoplasm of cervix: Secondary | ICD-10-CM | POA: Diagnosis not present

## 2018-07-14 DIAGNOSIS — J301 Allergic rhinitis due to pollen: Secondary | ICD-10-CM | POA: Diagnosis not present

## 2018-07-14 DIAGNOSIS — Z3041 Encounter for surveillance of contraceptive pills: Secondary | ICD-10-CM

## 2018-07-14 DIAGNOSIS — N809 Endometriosis, unspecified: Secondary | ICD-10-CM

## 2018-07-14 DIAGNOSIS — Z1211 Encounter for screening for malignant neoplasm of colon: Secondary | ICD-10-CM

## 2018-07-14 DIAGNOSIS — Z01419 Encounter for gynecological examination (general) (routine) without abnormal findings: Secondary | ICD-10-CM

## 2018-07-14 DIAGNOSIS — Z3009 Encounter for other general counseling and advice on contraception: Secondary | ICD-10-CM

## 2018-07-14 DIAGNOSIS — J3089 Other allergic rhinitis: Secondary | ICD-10-CM | POA: Diagnosis not present

## 2018-07-14 MED ORDER — NORETHINDRONE-ETH ESTRADIOL 1-35 MG-MCG PO TABS
1.0000 | ORAL_TABLET | Freq: Every day | ORAL | 4 refills | Status: AC
Start: 2018-07-14 — End: ?

## 2018-07-14 NOTE — Patient Instructions (Signed)
EXERCISE AND DIET:  We recommended that you start or continue a regular exercise program for good health. Regular exercise means any activity that makes your heart beat faster and makes you sweat.  We recommend exercising at least 30 minutes per day at least 3 days a week, preferably 4 or 5.  We also recommend a diet low in fat and sugar.  Inactivity, poor dietary choices and obesity can cause diabetes, heart attack, stroke, and kidney damage, among others.    ALCOHOL AND SMOKING:  Women should limit their alcohol intake to no more than 7 drinks/beers/glasses of wine (combined, not each!) per week. Moderation of alcohol intake to this level decreases your risk of breast cancer and liver damage. And of course, no recreational drugs are part of a healthy lifestyle.  And absolutely no smoking or even second hand smoke. Most people know smoking can cause heart and lung diseases, but did you know it also contributes to weakening of your bones? Aging of your skin?  Yellowing of your teeth and nails?  CALCIUM AND VITAMIN D:  Adequate intake of calcium and Vitamin D are recommended.  The recommendations for exact amounts of these supplements seem to change often, but generally speaking 1,000 mg of calcium (between diet and supplement) and 800 units of Vitamin D per day seems prudent. Certain women may benefit from higher intake of Vitamin D.  If you are among these women, your doctor will have told you during your visit.    PAP SMEARS:  Pap smears, to check for cervical cancer or precancers,  have traditionally been done yearly, although recent scientific advances have shown that most women can have pap smears less often.  However, every woman still should have a physical exam from her gynecologist every year. It will include a breast check, inspection of the vulva and vagina to check for abnormal growths or skin changes, a visual exam of the cervix, and then an exam to evaluate the size and shape of the uterus and  ovaries.  And after 45 years of age, a rectal exam is indicated to check for rectal cancers. We will also provide age appropriate advice regarding health maintenance, like when you should have certain vaccines, screening for sexually transmitted diseases, bone density testing, colonoscopy, mammograms, etc.   MAMMOGRAMS:  All women over 40 years old should have a yearly mammogram. Many facilities now offer a "3D" mammogram, which may cost around $50 extra out of pocket. If possible,  we recommend you accept the option to have the 3D mammogram performed.  It both reduces the number of women who will be called back for extra views which then turn out to be normal, and it is better than the routine mammogram at detecting truly abnormal areas.    COLON CANCER SCREENING: Now recommend starting at age 45. At this time colonoscopy is not covered for routine screening until 50. There are take home tests that can be done between 45-49.   COLONOSCOPY:  Colonoscopy to screen for colon cancer is recommended for all women at age 50.  We know, you hate the idea of the prep.  We agree, BUT, having colon cancer and not knowing it is worse!!  Colon cancer so often starts as a polyp that can be seen and removed at colonscopy, which can quite literally save your life!  And if your first colonoscopy is normal and you have no family history of colon cancer, most women don't have to have it again for   10 years.  Once every ten years, you can do something that may end up saving your life, right?  We will be happy to help you get it scheduled when you are ready.  Be sure to check your insurance coverage so you understand how much it will cost.  It may be covered as a preventative service at no cost, but you should check your particular policy.      Breast Self-Awareness Breast self-awareness means being familiar with how your breasts look and feel. It involves checking your breasts regularly and reporting any changes to your  health care provider. Practicing breast self-awareness is important. A change in your breasts can be a sign of a serious medical problem. Being familiar with how your breasts look and feel allows you to find any problems early, when treatment is more likely to be successful. All women should practice breast self-awareness, including women who have had breast implants. How to do a breast self-exam One way to learn what is normal for your breasts and whether your breasts are changing is to do a breast self-exam. To do a breast self-exam: Look for Changes  1. Remove all the clothing above your waist. 2. Stand in front of a mirror in a room with good lighting. 3. Put your hands on your hips. 4. Push your hands firmly downward. 5. Compare your breasts in the mirror. Look for differences between them (asymmetry), such as: ? Differences in shape. ? Differences in size. ? Puckers, dips, and bumps in one breast and not the other. 6. Look at each breast for changes in your skin, such as: ? Redness. ? Scaly areas. 7. Look for changes in your nipples, such as: ? Discharge. ? Bleeding. ? Dimpling. ? Redness. ? A change in position. Feel for Changes Carefully feel your breasts for lumps and changes. It is best to do this while lying on your back on the floor and again while sitting or standing in the shower or tub with soapy water on your skin. Feel each breast in the following way:  Place the arm on the side of the breast you are examining above your head.  Feel your breast with the other hand.  Start in the nipple area and make  inch (2 cm) overlapping circles to feel your breast. Use the pads of your three middle fingers to do this. Apply light pressure, then medium pressure, then firm pressure. The light pressure will allow you to feel the tissue closest to the skin. The medium pressure will allow you to feel the tissue that is a little deeper. The firm pressure will allow you to feel the tissue  close to the ribs.  Continue the overlapping circles, moving downward over the breast until you feel your ribs below your breast.  Move one finger-width toward the center of the body. Continue to use the  inch (2 cm) overlapping circles to feel your breast as you move slowly up toward your collarbone.  Continue the up and down exam using all three pressures until you reach your armpit.  Write Down What You Find  Write down what is normal for each breast and any changes that you find. Keep a written record with breast changes or normal findings for each breast. By writing this information down, you do not need to depend only on memory for size, tenderness, or location. Write down where you are in your menstrual cycle, if you are still menstruating. If you are having trouble noticing differences   in your breasts, do not get discouraged. With time you will become more familiar with the variations in your breasts and more comfortable with the exam. How often should I examine my breasts? Examine your breasts every month. If you are breastfeeding, the best time to examine your breasts is after a feeding or after using a breast pump. If you menstruate, the best time to examine your breasts is 5-7 days after your period is over. During your period, your breasts are lumpier, and it may be more difficult to notice changes. When should I see my health care provider? See your health care provider if you notice:  A change in shape or size of your breasts or nipples.  A change in the skin of your breast or nipples, such as a reddened or scaly area.  Unusual discharge from your nipples.  A lump or thick area that was not there before.  Pain in your breasts.  Anything that concerns you.  

## 2018-07-14 NOTE — Addendum Note (Signed)
Addended by: Ginny ForthSPRAGUE, Pasquale Matters E on: 07/14/2018 01:51 PM   Modules accepted: Orders

## 2018-07-14 NOTE — Progress Notes (Signed)
47 y.o. G0P0000 Single White or Caucasian Not Hispanic or Latino female here for annual exam.  She has a h/o endometriosis and is on continuous OCP's. Was told she had severe endometriosis, was on Lupron for 1 year and was then started on continuous OCP's.  She has 3 planned cycles a year. Just recently her cycles haven't been as painful. Typically she is debilitated for an entire week of her cycle. She occasionally has breakthrough bleeding, then she gives her self a break and has a cycle.  She has pre-diabetes.  She c/o an increase in vaginal discharge, large amount, clear, slight odor. No itching, burning or irritation. Occasionally gets little bumps or cysts on her labia minora.  She would like to have a child, wondering about getting pregnant. Doesn't have a partner currently.   She gained 60 lbs in the last few years. Started when her mom had a series of strokes and died 3 years ago.   She plans to do more dancing and loose weight.   Period Duration (Days): 5 days Period Pattern: Regular(3 planned cycles with OCP a year) Menstrual Flow: Heavy Menstrual Control: Thin pad Menstrual Control Change Freq (Hours): changes tampon/pad every 3 hours Dysmenorrhea: (!) Severe Dysmenorrhea Symptoms: Cramping  Patient's last menstrual period was 01/17/2018 (exact date).          Sexually active: No.  The current method of family planning is OCP (estrogen/progesterone).    Exercising: Yes.    dance, ice skating Smoker:  no  Health Maintenance: Pap:  2017 WNL History of abnormal Pap:  no MMG:  Never BMD:   Never Colonoscopy: Never TDaP:  2016 Gardasil: Never   reports that she has never smoked. She has never used smokeless tobacco. She reports that she does not drink alcohol or use drugs. 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  Past Medical History:  Diagnosis Date  . Allergy   . Dysmenorrhea   . Endometriosis    . Ovarian cyst   . PCOS (polycystic ovarian syndrome)     Past Surgical History:  Procedure Laterality Date  . LAPAROSCOPIC ENDOMETRIOSIS FULGURATION  1997    Current Outpatient Medications  Medication Sig Dispense Refill  . albuterol (PROVENTIL HFA;VENTOLIN HFA) 108 (90 Base) MCG/ACT inhaler Inhale 2 puffs into the lungs as needed.    Marland Kitchen. azelastine (ASTELIN) 0.1 % nasal spray Place 2 sprays into both nostrils 2 (two) times daily. Use in each nostril as directed 30 mL PRN  . benzonatate (TESSALON) 200 MG capsule Take 1 capsule (200 mg total) by mouth 2 (two) times daily as needed for cough. 20 capsule 0  . fluticasone (FLONASE) 50 MCG/ACT nasal spray Place 2 sprays into both nostrils at bedtime. 54 g 3  . Fluticasone Furoate (ARNUITY ELLIPTA) 100 MCG/ACT AEPB Inhale 1 puff into the lungs daily.    Marland Kitchen. levocetirizine (XYZAL) 5 MG tablet Take 5 mg by mouth every evening.    . norethindrone-ethinyl estradiol 1/35 (ORTHO-NOVUM, NORTREL,CYCLAFEM) tablet Take 1 tablet daily by mouth. take continuously, skip non-hormone containing tablets. 4 Package 4  . OVER THE COUNTER MEDICATION Womens best friend as PRN    . Tiotropium Bromide Monohydrate (SPIRIVA RESPIMAT) 1.25 MCG/ACT AERS Inhale into the lungs.     No current facility-administered medications for this visit.     Family History  Problem Relation Age of Onset  . Diabetes Mother   . Arthritis Mother  Rheumatoid and Osteoarthritis  . Stroke Mother   . Heart disease Paternal Grandfather     Review of Systems  Constitutional: Negative.   HENT: Negative.   Eyes: Negative.   Respiratory: Negative.   Cardiovascular: Negative.   Gastrointestinal: Negative.   Endocrine: Negative.   Genitourinary: Negative.   Musculoskeletal: Negative.   Skin: Negative.   Allergic/Immunologic: Negative.   Neurological: Negative.   Hematological: Negative.   Psychiatric/Behavioral: Negative.     Exam:   BP 122/64 (BP Location: Right Arm,  Patient Position: Sitting, Cuff Size: Large)   Pulse 64   Ht 5' 3.5" (1.613 m)   Wt 245 lb (111.1 kg)   LMP 01/17/2018 (Exact Date)   BMI 42.72 kg/m   Weight change: @WEIGHTCHANGE @ Height:   Height: 5' 3.5" (161.3 cm)  Ht Readings from Last 3 Encounters:  07/14/18 5' 3.5" (1.613 m)  10/25/17 5\' 5"  (1.651 m)  06/09/17 5' 5.75" (1.67 m)    General appearance: alert, cooperative and appears stated age Head: Normocephalic, without obvious abnormality, atraumatic Neck: no adenopathy, supple, symmetrical, trachea midline and thyroid normal to inspection and palpation Lungs: clear to auscultation bilaterally Cardiovascular: regular rate and rhythm Breasts: normal appearance, no masses or tenderness Abdomen: soft, non-tender; non distended,  no masses,  no organomegaly Extremities: extremities normal, atraumatic, no cyanosis or edema Skin: Skin color, texture, turgor normal. No rashes or lesions Lymph nodes: Cervical, supraclavicular, and axillary nodes normal. No abnormal inguinal nodes palpated Neurologic: Grossly normal   Pelvic: External genitalia:  no lesions              Urethra:  normal appearing urethra with no masses, tenderness or lesions              Bartholins and Skenes: normal                 Vagina: normal appearing vagina with normal color and discharge, no lesions              Cervix: no lesions               Bimanual Exam:  Uterus:  normal size, contour, position, consistency, mobility, non-tender              Adnexa: no mass, fullness, tenderness               Rectovaginal: Confirms               Anus:  normal sphincter tone, no lesions  Chaperone was present for exam.  A:  Well Woman with normal exam  H/O endometriosis, stable on continuous OCP's  Considering having a child, discussed the low likelihood of her conceiving on her own. Discussed risks of chromosomal abnormalities and the increased medical risks to her.     P:   She will f/u with her primary for  fasting labs  Pap with hpv  Affirm  Dr Lyndal RainbowYalcinkaya's # given, referral sent  Discussed breast self exam  Discussed calcium and vit D intake  Cologuard ordered  Mammogram overdue, #'s given  Continue OCP's

## 2018-07-15 LAB — VAGINITIS/VAGINOSIS, DNA PROBE
Candida Species: NEGATIVE
Gardnerella vaginalis: NEGATIVE
Trichomonas vaginosis: NEGATIVE

## 2018-07-18 LAB — CYTOLOGY - PAP
DIAGNOSIS: NEGATIVE
HPV: NOT DETECTED

## 2018-07-21 DIAGNOSIS — J301 Allergic rhinitis due to pollen: Secondary | ICD-10-CM | POA: Diagnosis not present

## 2018-07-21 DIAGNOSIS — J3089 Other allergic rhinitis: Secondary | ICD-10-CM | POA: Diagnosis not present

## 2018-07-21 DIAGNOSIS — J3081 Allergic rhinitis due to animal (cat) (dog) hair and dander: Secondary | ICD-10-CM | POA: Diagnosis not present

## 2018-08-01 DIAGNOSIS — J3081 Allergic rhinitis due to animal (cat) (dog) hair and dander: Secondary | ICD-10-CM | POA: Diagnosis not present

## 2018-08-01 DIAGNOSIS — J3089 Other allergic rhinitis: Secondary | ICD-10-CM | POA: Diagnosis not present

## 2018-08-01 DIAGNOSIS — J301 Allergic rhinitis due to pollen: Secondary | ICD-10-CM | POA: Diagnosis not present

## 2018-08-08 DIAGNOSIS — J3089 Other allergic rhinitis: Secondary | ICD-10-CM | POA: Diagnosis not present

## 2018-08-08 DIAGNOSIS — J3081 Allergic rhinitis due to animal (cat) (dog) hair and dander: Secondary | ICD-10-CM | POA: Diagnosis not present

## 2018-08-08 DIAGNOSIS — J301 Allergic rhinitis due to pollen: Secondary | ICD-10-CM | POA: Diagnosis not present

## 2018-08-17 DIAGNOSIS — J3089 Other allergic rhinitis: Secondary | ICD-10-CM | POA: Diagnosis not present

## 2018-08-17 DIAGNOSIS — J3081 Allergic rhinitis due to animal (cat) (dog) hair and dander: Secondary | ICD-10-CM | POA: Diagnosis not present

## 2018-08-17 DIAGNOSIS — J301 Allergic rhinitis due to pollen: Secondary | ICD-10-CM | POA: Diagnosis not present

## 2018-08-31 DIAGNOSIS — J301 Allergic rhinitis due to pollen: Secondary | ICD-10-CM | POA: Diagnosis not present

## 2018-08-31 DIAGNOSIS — J3081 Allergic rhinitis due to animal (cat) (dog) hair and dander: Secondary | ICD-10-CM | POA: Diagnosis not present

## 2018-08-31 DIAGNOSIS — J3089 Other allergic rhinitis: Secondary | ICD-10-CM | POA: Diagnosis not present

## 2018-09-12 DIAGNOSIS — J3081 Allergic rhinitis due to animal (cat) (dog) hair and dander: Secondary | ICD-10-CM | POA: Diagnosis not present

## 2018-09-12 DIAGNOSIS — J301 Allergic rhinitis due to pollen: Secondary | ICD-10-CM | POA: Diagnosis not present

## 2018-09-12 DIAGNOSIS — J3089 Other allergic rhinitis: Secondary | ICD-10-CM | POA: Diagnosis not present

## 2018-09-19 MED FILL — ALYACEN 1-35-28 TABLET: 1-35 | 84 days supply | Qty: 112 | Fill #1

## 2018-09-19 MED FILL — ARNUITY ELLIPTA 100 MCG INH: 100 | 30 days supply | Qty: 30 | Fill #3

## 2018-09-20 DIAGNOSIS — J301 Allergic rhinitis due to pollen: Secondary | ICD-10-CM | POA: Diagnosis not present

## 2018-09-20 DIAGNOSIS — J3089 Other allergic rhinitis: Secondary | ICD-10-CM | POA: Diagnosis not present

## 2018-09-20 DIAGNOSIS — J3081 Allergic rhinitis due to animal (cat) (dog) hair and dander: Secondary | ICD-10-CM | POA: Diagnosis not present

## 2018-09-27 DIAGNOSIS — J3081 Allergic rhinitis due to animal (cat) (dog) hair and dander: Secondary | ICD-10-CM | POA: Diagnosis not present

## 2018-09-27 DIAGNOSIS — J3089 Other allergic rhinitis: Secondary | ICD-10-CM | POA: Diagnosis not present

## 2018-09-27 DIAGNOSIS — J301 Allergic rhinitis due to pollen: Secondary | ICD-10-CM | POA: Diagnosis not present

## 2018-10-04 DIAGNOSIS — J3089 Other allergic rhinitis: Secondary | ICD-10-CM | POA: Diagnosis not present

## 2018-10-04 DIAGNOSIS — J3081 Allergic rhinitis due to animal (cat) (dog) hair and dander: Secondary | ICD-10-CM | POA: Diagnosis not present

## 2018-10-04 DIAGNOSIS — J301 Allergic rhinitis due to pollen: Secondary | ICD-10-CM | POA: Diagnosis not present

## 2018-10-12 DIAGNOSIS — J301 Allergic rhinitis due to pollen: Secondary | ICD-10-CM | POA: Diagnosis not present

## 2018-10-12 DIAGNOSIS — J3081 Allergic rhinitis due to animal (cat) (dog) hair and dander: Secondary | ICD-10-CM | POA: Diagnosis not present

## 2018-10-12 DIAGNOSIS — J3089 Other allergic rhinitis: Secondary | ICD-10-CM | POA: Diagnosis not present

## 2018-10-25 DIAGNOSIS — J301 Allergic rhinitis due to pollen: Secondary | ICD-10-CM | POA: Diagnosis not present

## 2018-10-25 DIAGNOSIS — J3089 Other allergic rhinitis: Secondary | ICD-10-CM | POA: Diagnosis not present

## 2018-10-25 DIAGNOSIS — J3081 Allergic rhinitis due to animal (cat) (dog) hair and dander: Secondary | ICD-10-CM | POA: Diagnosis not present

## 2018-11-07 DIAGNOSIS — J3089 Other allergic rhinitis: Secondary | ICD-10-CM | POA: Diagnosis not present

## 2018-11-07 DIAGNOSIS — J301 Allergic rhinitis due to pollen: Secondary | ICD-10-CM | POA: Diagnosis not present

## 2018-11-07 DIAGNOSIS — J3081 Allergic rhinitis due to animal (cat) (dog) hair and dander: Secondary | ICD-10-CM | POA: Diagnosis not present

## 2018-11-18 DIAGNOSIS — J301 Allergic rhinitis due to pollen: Secondary | ICD-10-CM | POA: Diagnosis not present

## 2018-11-18 DIAGNOSIS — J3081 Allergic rhinitis due to animal (cat) (dog) hair and dander: Secondary | ICD-10-CM | POA: Diagnosis not present

## 2018-11-18 DIAGNOSIS — J3089 Other allergic rhinitis: Secondary | ICD-10-CM | POA: Diagnosis not present

## 2018-11-25 DIAGNOSIS — J4540 Moderate persistent asthma, uncomplicated: Secondary | ICD-10-CM | POA: Diagnosis not present

## 2018-11-25 DIAGNOSIS — J3089 Other allergic rhinitis: Secondary | ICD-10-CM | POA: Diagnosis not present

## 2018-11-25 DIAGNOSIS — J3081 Allergic rhinitis due to animal (cat) (dog) hair and dander: Secondary | ICD-10-CM | POA: Diagnosis not present

## 2018-11-25 DIAGNOSIS — H1045 Other chronic allergic conjunctivitis: Secondary | ICD-10-CM | POA: Diagnosis not present

## 2018-11-25 DIAGNOSIS — J301 Allergic rhinitis due to pollen: Secondary | ICD-10-CM | POA: Diagnosis not present

## 2018-11-29 DIAGNOSIS — J301 Allergic rhinitis due to pollen: Secondary | ICD-10-CM | POA: Diagnosis not present

## 2018-11-29 DIAGNOSIS — J3081 Allergic rhinitis due to animal (cat) (dog) hair and dander: Secondary | ICD-10-CM | POA: Diagnosis not present

## 2018-12-01 DIAGNOSIS — J3081 Allergic rhinitis due to animal (cat) (dog) hair and dander: Secondary | ICD-10-CM | POA: Diagnosis not present

## 2018-12-01 DIAGNOSIS — J301 Allergic rhinitis due to pollen: Secondary | ICD-10-CM | POA: Diagnosis not present

## 2018-12-01 DIAGNOSIS — J3089 Other allergic rhinitis: Secondary | ICD-10-CM | POA: Diagnosis not present

## 2018-12-08 MED FILL — ALYACEN 1-35-28 TABLET: 1-35 | 84 days supply | Qty: 112 | Fill #2

## 2018-12-08 MED FILL — VENTOLIN HFA 90 MCG INHALER: 108 (90 BAS | 30 days supply | Qty: 18 | Fill #1

## 2018-12-08 MED FILL — ARNUITY ELLIPTA 100 MCG INH: 100 | 30 days supply | Qty: 30 | Fill #4

## 2018-12-09 DIAGNOSIS — J301 Allergic rhinitis due to pollen: Secondary | ICD-10-CM | POA: Diagnosis not present

## 2018-12-09 DIAGNOSIS — J3089 Other allergic rhinitis: Secondary | ICD-10-CM | POA: Diagnosis not present

## 2018-12-09 DIAGNOSIS — J3081 Allergic rhinitis due to animal (cat) (dog) hair and dander: Secondary | ICD-10-CM | POA: Diagnosis not present

## 2018-12-15 DIAGNOSIS — J301 Allergic rhinitis due to pollen: Secondary | ICD-10-CM | POA: Diagnosis not present

## 2018-12-15 DIAGNOSIS — J3081 Allergic rhinitis due to animal (cat) (dog) hair and dander: Secondary | ICD-10-CM | POA: Diagnosis not present

## 2018-12-15 DIAGNOSIS — J3089 Other allergic rhinitis: Secondary | ICD-10-CM | POA: Diagnosis not present

## 2018-12-19 DIAGNOSIS — J301 Allergic rhinitis due to pollen: Secondary | ICD-10-CM | POA: Diagnosis not present

## 2018-12-19 DIAGNOSIS — J3089 Other allergic rhinitis: Secondary | ICD-10-CM | POA: Diagnosis not present

## 2018-12-19 DIAGNOSIS — J3081 Allergic rhinitis due to animal (cat) (dog) hair and dander: Secondary | ICD-10-CM | POA: Diagnosis not present

## 2018-12-23 DIAGNOSIS — J301 Allergic rhinitis due to pollen: Secondary | ICD-10-CM | POA: Diagnosis not present

## 2018-12-23 DIAGNOSIS — J3081 Allergic rhinitis due to animal (cat) (dog) hair and dander: Secondary | ICD-10-CM | POA: Diagnosis not present

## 2018-12-27 DIAGNOSIS — J301 Allergic rhinitis due to pollen: Secondary | ICD-10-CM | POA: Diagnosis not present

## 2018-12-27 DIAGNOSIS — J3081 Allergic rhinitis due to animal (cat) (dog) hair and dander: Secondary | ICD-10-CM | POA: Diagnosis not present

## 2018-12-27 DIAGNOSIS — J3089 Other allergic rhinitis: Secondary | ICD-10-CM | POA: Diagnosis not present

## 2018-12-30 DIAGNOSIS — J3081 Allergic rhinitis due to animal (cat) (dog) hair and dander: Secondary | ICD-10-CM | POA: Diagnosis not present

## 2018-12-30 DIAGNOSIS — J301 Allergic rhinitis due to pollen: Secondary | ICD-10-CM | POA: Diagnosis not present

## 2018-12-30 DIAGNOSIS — J3089 Other allergic rhinitis: Secondary | ICD-10-CM | POA: Diagnosis not present

## 2019-01-05 DIAGNOSIS — J3081 Allergic rhinitis due to animal (cat) (dog) hair and dander: Secondary | ICD-10-CM | POA: Diagnosis not present

## 2019-01-05 DIAGNOSIS — J3089 Other allergic rhinitis: Secondary | ICD-10-CM | POA: Diagnosis not present

## 2019-01-05 DIAGNOSIS — J301 Allergic rhinitis due to pollen: Secondary | ICD-10-CM | POA: Diagnosis not present

## 2019-01-13 DIAGNOSIS — J3081 Allergic rhinitis due to animal (cat) (dog) hair and dander: Secondary | ICD-10-CM | POA: Diagnosis not present

## 2019-01-13 DIAGNOSIS — J3089 Other allergic rhinitis: Secondary | ICD-10-CM | POA: Diagnosis not present

## 2019-01-13 DIAGNOSIS — J301 Allergic rhinitis due to pollen: Secondary | ICD-10-CM | POA: Diagnosis not present

## 2019-01-23 DIAGNOSIS — J301 Allergic rhinitis due to pollen: Secondary | ICD-10-CM | POA: Diagnosis not present

## 2019-01-23 DIAGNOSIS — J3081 Allergic rhinitis due to animal (cat) (dog) hair and dander: Secondary | ICD-10-CM | POA: Diagnosis not present

## 2019-01-23 DIAGNOSIS — J3089 Other allergic rhinitis: Secondary | ICD-10-CM | POA: Diagnosis not present

## 2019-01-31 DIAGNOSIS — J3089 Other allergic rhinitis: Secondary | ICD-10-CM | POA: Diagnosis not present

## 2019-01-31 DIAGNOSIS — J3081 Allergic rhinitis due to animal (cat) (dog) hair and dander: Secondary | ICD-10-CM | POA: Diagnosis not present

## 2019-01-31 DIAGNOSIS — J301 Allergic rhinitis due to pollen: Secondary | ICD-10-CM | POA: Diagnosis not present

## 2019-02-15 DIAGNOSIS — J3081 Allergic rhinitis due to animal (cat) (dog) hair and dander: Secondary | ICD-10-CM | POA: Diagnosis not present

## 2019-02-15 DIAGNOSIS — J3089 Other allergic rhinitis: Secondary | ICD-10-CM | POA: Diagnosis not present

## 2019-02-15 DIAGNOSIS — J301 Allergic rhinitis due to pollen: Secondary | ICD-10-CM | POA: Diagnosis not present

## 2019-02-21 DIAGNOSIS — J3081 Allergic rhinitis due to animal (cat) (dog) hair and dander: Secondary | ICD-10-CM | POA: Diagnosis not present

## 2019-02-21 DIAGNOSIS — J301 Allergic rhinitis due to pollen: Secondary | ICD-10-CM | POA: Diagnosis not present

## 2019-02-21 DIAGNOSIS — J3089 Other allergic rhinitis: Secondary | ICD-10-CM | POA: Diagnosis not present

## 2019-03-02 DIAGNOSIS — J3081 Allergic rhinitis due to animal (cat) (dog) hair and dander: Secondary | ICD-10-CM | POA: Diagnosis not present

## 2019-03-02 DIAGNOSIS — J3089 Other allergic rhinitis: Secondary | ICD-10-CM | POA: Diagnosis not present

## 2019-03-02 DIAGNOSIS — J301 Allergic rhinitis due to pollen: Secondary | ICD-10-CM | POA: Diagnosis not present

## 2019-03-02 MED FILL — VENTOLIN HFA 90 MCG INHALER: 108 (90 BAS | 30 days supply | Qty: 18 | Fill #2

## 2019-03-02 MED FILL — ARNUITY ELLIPTA 100 MCG INH: 100 | 30 days supply | Qty: 30 | Fill #5

## 2019-03-02 MED FILL — ALYACEN 1-35-28 TABLET: 1-35 | 84 days supply | Qty: 112 | Fill #0

## 2019-03-09 DIAGNOSIS — J3081 Allergic rhinitis due to animal (cat) (dog) hair and dander: Secondary | ICD-10-CM | POA: Diagnosis not present

## 2019-03-09 DIAGNOSIS — J3089 Other allergic rhinitis: Secondary | ICD-10-CM | POA: Diagnosis not present

## 2019-03-09 DIAGNOSIS — J301 Allergic rhinitis due to pollen: Secondary | ICD-10-CM | POA: Diagnosis not present

## 2019-03-13 DIAGNOSIS — J301 Allergic rhinitis due to pollen: Secondary | ICD-10-CM | POA: Diagnosis not present

## 2019-03-13 DIAGNOSIS — J3089 Other allergic rhinitis: Secondary | ICD-10-CM | POA: Diagnosis not present

## 2019-03-13 DIAGNOSIS — J3081 Allergic rhinitis due to animal (cat) (dog) hair and dander: Secondary | ICD-10-CM | POA: Diagnosis not present

## 2019-03-20 DIAGNOSIS — J3081 Allergic rhinitis due to animal (cat) (dog) hair and dander: Secondary | ICD-10-CM | POA: Diagnosis not present

## 2019-03-20 DIAGNOSIS — J3089 Other allergic rhinitis: Secondary | ICD-10-CM | POA: Diagnosis not present

## 2019-03-20 DIAGNOSIS — J301 Allergic rhinitis due to pollen: Secondary | ICD-10-CM | POA: Diagnosis not present

## 2019-03-31 DIAGNOSIS — J3081 Allergic rhinitis due to animal (cat) (dog) hair and dander: Secondary | ICD-10-CM | POA: Diagnosis not present

## 2019-03-31 DIAGNOSIS — J301 Allergic rhinitis due to pollen: Secondary | ICD-10-CM | POA: Diagnosis not present

## 2019-03-31 DIAGNOSIS — J3089 Other allergic rhinitis: Secondary | ICD-10-CM | POA: Diagnosis not present

## 2019-04-11 DIAGNOSIS — J3081 Allergic rhinitis due to animal (cat) (dog) hair and dander: Secondary | ICD-10-CM | POA: Diagnosis not present

## 2019-04-11 DIAGNOSIS — J301 Allergic rhinitis due to pollen: Secondary | ICD-10-CM | POA: Diagnosis not present

## 2019-04-11 DIAGNOSIS — J3089 Other allergic rhinitis: Secondary | ICD-10-CM | POA: Diagnosis not present

## 2019-04-19 DIAGNOSIS — J3081 Allergic rhinitis due to animal (cat) (dog) hair and dander: Secondary | ICD-10-CM | POA: Diagnosis not present

## 2019-04-19 DIAGNOSIS — J3089 Other allergic rhinitis: Secondary | ICD-10-CM | POA: Diagnosis not present

## 2019-04-19 DIAGNOSIS — J301 Allergic rhinitis due to pollen: Secondary | ICD-10-CM | POA: Diagnosis not present

## 2019-04-26 DIAGNOSIS — J3081 Allergic rhinitis due to animal (cat) (dog) hair and dander: Secondary | ICD-10-CM | POA: Diagnosis not present

## 2019-04-26 DIAGNOSIS — J301 Allergic rhinitis due to pollen: Secondary | ICD-10-CM | POA: Diagnosis not present

## 2019-04-26 DIAGNOSIS — Z23 Encounter for immunization: Secondary | ICD-10-CM | POA: Diagnosis not present

## 2019-04-26 DIAGNOSIS — J3089 Other allergic rhinitis: Secondary | ICD-10-CM | POA: Diagnosis not present

## 2019-05-04 DIAGNOSIS — R1013 Epigastric pain: Secondary | ICD-10-CM | POA: Diagnosis not present

## 2019-05-04 DIAGNOSIS — R1011 Right upper quadrant pain: Secondary | ICD-10-CM | POA: Diagnosis not present

## 2019-05-04 DIAGNOSIS — J3089 Other allergic rhinitis: Secondary | ICD-10-CM | POA: Diagnosis not present

## 2019-05-04 DIAGNOSIS — J301 Allergic rhinitis due to pollen: Secondary | ICD-10-CM | POA: Diagnosis not present

## 2019-05-04 DIAGNOSIS — K8020 Calculus of gallbladder without cholecystitis without obstruction: Secondary | ICD-10-CM | POA: Diagnosis not present

## 2019-05-04 DIAGNOSIS — R0789 Other chest pain: Secondary | ICD-10-CM | POA: Diagnosis not present

## 2019-05-04 DIAGNOSIS — J3081 Allergic rhinitis due to animal (cat) (dog) hair and dander: Secondary | ICD-10-CM | POA: Diagnosis not present

## 2019-05-05 DIAGNOSIS — R7309 Other abnormal glucose: Secondary | ICD-10-CM | POA: Diagnosis not present

## 2019-05-08 DIAGNOSIS — J301 Allergic rhinitis due to pollen: Secondary | ICD-10-CM | POA: Diagnosis not present

## 2019-05-08 DIAGNOSIS — K760 Fatty (change of) liver, not elsewhere classified: Secondary | ICD-10-CM | POA: Diagnosis not present

## 2019-05-08 DIAGNOSIS — J3089 Other allergic rhinitis: Secondary | ICD-10-CM | POA: Diagnosis not present

## 2019-05-08 DIAGNOSIS — J3081 Allergic rhinitis due to animal (cat) (dog) hair and dander: Secondary | ICD-10-CM | POA: Diagnosis not present

## 2019-05-08 DIAGNOSIS — K8020 Calculus of gallbladder without cholecystitis without obstruction: Secondary | ICD-10-CM | POA: Diagnosis not present

## 2019-05-08 DIAGNOSIS — R7303 Prediabetes: Secondary | ICD-10-CM | POA: Diagnosis not present

## 2019-05-08 DIAGNOSIS — K828 Other specified diseases of gallbladder: Secondary | ICD-10-CM | POA: Diagnosis not present

## 2019-05-12 DIAGNOSIS — K8020 Calculus of gallbladder without cholecystitis without obstruction: Secondary | ICD-10-CM | POA: Diagnosis not present

## 2019-05-15 DIAGNOSIS — J301 Allergic rhinitis due to pollen: Secondary | ICD-10-CM | POA: Diagnosis not present

## 2019-05-15 DIAGNOSIS — J3081 Allergic rhinitis due to animal (cat) (dog) hair and dander: Secondary | ICD-10-CM | POA: Diagnosis not present

## 2019-05-16 DIAGNOSIS — J3089 Other allergic rhinitis: Secondary | ICD-10-CM | POA: Diagnosis not present

## 2019-05-17 DIAGNOSIS — Z1239 Encounter for other screening for malignant neoplasm of breast: Secondary | ICD-10-CM | POA: Diagnosis not present

## 2019-05-17 DIAGNOSIS — Z3141 Encounter for fertility testing: Secondary | ICD-10-CM | POA: Diagnosis not present

## 2019-05-17 DIAGNOSIS — Z6841 Body Mass Index (BMI) 40.0 and over, adult: Secondary | ICD-10-CM | POA: Diagnosis not present

## 2019-05-17 DIAGNOSIS — Z01419 Encounter for gynecological examination (general) (routine) without abnormal findings: Secondary | ICD-10-CM | POA: Diagnosis not present

## 2019-05-18 DIAGNOSIS — J301 Allergic rhinitis due to pollen: Secondary | ICD-10-CM | POA: Diagnosis not present

## 2019-05-18 DIAGNOSIS — J3089 Other allergic rhinitis: Secondary | ICD-10-CM | POA: Diagnosis not present

## 2019-05-18 DIAGNOSIS — J4540 Moderate persistent asthma, uncomplicated: Secondary | ICD-10-CM | POA: Diagnosis not present

## 2019-05-18 DIAGNOSIS — H1045 Other chronic allergic conjunctivitis: Secondary | ICD-10-CM | POA: Diagnosis not present

## 2019-05-18 DIAGNOSIS — J3081 Allergic rhinitis due to animal (cat) (dog) hair and dander: Secondary | ICD-10-CM | POA: Diagnosis not present

## 2019-05-18 MED FILL — AZELASTINE HCL 137 MCG SPRY: 0.1 | 50 days supply | Qty: 30 | Fill #0

## 2019-05-18 MED FILL — SPIRIVA RESPIMAT 1.25 MCG I: 1.25 | 30 days supply | Qty: 4 | Fill #0

## 2019-05-18 MED FILL — VENTOLIN HFA 90 MCG INHALER: 108 (90 BAS | 16 days supply | Qty: 18 | Fill #0

## 2019-05-18 MED FILL — ARNUITY ELLIPTA 100 MCG INH: 100 | 30 days supply | Qty: 30 | Fill #0

## 2019-05-18 MED FILL — IPRATROPIUM 0.03% SPRAY: 0.03 | 43 days supply | Qty: 30 | Fill #0

## 2019-05-18 MED FILL — LEVOCETIRIZINE 5 MG TABLET: 5 | 30 days supply | Qty: 30 | Fill #0

## 2019-05-18 MED FILL — FLUTICASONE PROP 50 MCG SPR: 50 | 60 days supply | Qty: 16 | Fill #0

## 2019-05-19 DIAGNOSIS — Z713 Dietary counseling and surveillance: Secondary | ICD-10-CM | POA: Diagnosis not present

## 2019-05-24 DIAGNOSIS — J3081 Allergic rhinitis due to animal (cat) (dog) hair and dander: Secondary | ICD-10-CM | POA: Diagnosis not present

## 2019-05-24 DIAGNOSIS — J301 Allergic rhinitis due to pollen: Secondary | ICD-10-CM | POA: Diagnosis not present

## 2019-05-24 DIAGNOSIS — J3089 Other allergic rhinitis: Secondary | ICD-10-CM | POA: Diagnosis not present

## 2019-05-26 DIAGNOSIS — Z713 Dietary counseling and surveillance: Secondary | ICD-10-CM | POA: Diagnosis not present

## 2019-05-26 DIAGNOSIS — J3089 Other allergic rhinitis: Secondary | ICD-10-CM | POA: Diagnosis not present

## 2019-05-26 DIAGNOSIS — J3081 Allergic rhinitis due to animal (cat) (dog) hair and dander: Secondary | ICD-10-CM | POA: Diagnosis not present

## 2019-05-26 DIAGNOSIS — R7989 Other specified abnormal findings of blood chemistry: Secondary | ICD-10-CM | POA: Diagnosis not present

## 2019-05-26 DIAGNOSIS — E782 Mixed hyperlipidemia: Secondary | ICD-10-CM | POA: Diagnosis not present

## 2019-05-26 DIAGNOSIS — J301 Allergic rhinitis due to pollen: Secondary | ICD-10-CM | POA: Diagnosis not present

## 2019-05-26 DIAGNOSIS — R7303 Prediabetes: Secondary | ICD-10-CM | POA: Diagnosis not present

## 2019-05-26 MED FILL — LEVOTHYROXINE 25 MCG TABLET: 25 | 30 days supply | Qty: 30 | Fill #0

## 2019-05-29 DIAGNOSIS — Z01818 Encounter for other preprocedural examination: Secondary | ICD-10-CM | POA: Diagnosis not present

## 2019-06-01 DIAGNOSIS — J4540 Moderate persistent asthma, uncomplicated: Secondary | ICD-10-CM | POA: Diagnosis not present

## 2019-06-01 DIAGNOSIS — K8020 Calculus of gallbladder without cholecystitis without obstruction: Secondary | ICD-10-CM | POA: Diagnosis not present

## 2019-06-01 DIAGNOSIS — R197 Diarrhea, unspecified: Secondary | ICD-10-CM | POA: Diagnosis not present

## 2019-06-01 DIAGNOSIS — I10 Essential (primary) hypertension: Secondary | ICD-10-CM | POA: Diagnosis not present

## 2019-06-01 DIAGNOSIS — Z91040 Latex allergy status: Secondary | ICD-10-CM | POA: Diagnosis not present

## 2019-06-01 DIAGNOSIS — E782 Mixed hyperlipidemia: Secondary | ICD-10-CM | POA: Diagnosis not present

## 2019-06-01 DIAGNOSIS — Z79899 Other long term (current) drug therapy: Secondary | ICD-10-CM | POA: Diagnosis not present

## 2019-06-01 DIAGNOSIS — Z8249 Family history of ischemic heart disease and other diseases of the circulatory system: Secondary | ICD-10-CM | POA: Diagnosis not present

## 2019-06-01 DIAGNOSIS — Z888 Allergy status to other drugs, medicaments and biological substances status: Secondary | ICD-10-CM | POA: Diagnosis not present

## 2019-06-01 DIAGNOSIS — Z6841 Body Mass Index (BMI) 40.0 and over, adult: Secondary | ICD-10-CM | POA: Diagnosis not present

## 2019-06-01 DIAGNOSIS — K824 Cholesterolosis of gallbladder: Secondary | ICD-10-CM | POA: Diagnosis not present

## 2019-06-01 DIAGNOSIS — N809 Endometriosis, unspecified: Secondary | ICD-10-CM | POA: Diagnosis not present

## 2019-06-01 DIAGNOSIS — E039 Hypothyroidism, unspecified: Secondary | ICD-10-CM | POA: Diagnosis not present

## 2019-06-01 DIAGNOSIS — Z793 Long term (current) use of hormonal contraceptives: Secondary | ICD-10-CM | POA: Diagnosis not present

## 2019-06-01 DIAGNOSIS — K760 Fatty (change of) liver, not elsewhere classified: Secondary | ICD-10-CM | POA: Diagnosis not present

## 2019-06-01 MED FILL — POLYETHYLENE GLYCOL 3350 PO: 17 | 14 days supply | Qty: 238 | Fill #0

## 2019-06-01 MED FILL — HYDROCODON-APAP 5-325: 5-325 | 5 days supply | Qty: 20 | Fill #0

## 2019-06-01 MED FILL — ONDANSETRON HCL 4 MG TABLET: 4 | 6 days supply | Qty: 20 | Fill #0

## 2019-06-05 DIAGNOSIS — J3081 Allergic rhinitis due to animal (cat) (dog) hair and dander: Secondary | ICD-10-CM | POA: Diagnosis not present

## 2019-06-05 DIAGNOSIS — J301 Allergic rhinitis due to pollen: Secondary | ICD-10-CM | POA: Diagnosis not present

## 2019-06-05 DIAGNOSIS — J3089 Other allergic rhinitis: Secondary | ICD-10-CM | POA: Diagnosis not present

## 2019-06-12 DIAGNOSIS — Z713 Dietary counseling and surveillance: Secondary | ICD-10-CM | POA: Diagnosis not present

## 2019-06-14 DIAGNOSIS — J3081 Allergic rhinitis due to animal (cat) (dog) hair and dander: Secondary | ICD-10-CM | POA: Diagnosis not present

## 2019-06-14 DIAGNOSIS — J301 Allergic rhinitis due to pollen: Secondary | ICD-10-CM | POA: Diagnosis not present

## 2019-06-14 DIAGNOSIS — J3089 Other allergic rhinitis: Secondary | ICD-10-CM | POA: Diagnosis not present

## 2019-06-19 DIAGNOSIS — E2839 Other primary ovarian failure: Secondary | ICD-10-CM | POA: Diagnosis not present

## 2019-06-19 DIAGNOSIS — D252 Subserosal leiomyoma of uterus: Secondary | ICD-10-CM | POA: Diagnosis not present

## 2019-06-19 DIAGNOSIS — Z319 Encounter for procreative management, unspecified: Secondary | ICD-10-CM | POA: Diagnosis not present

## 2019-06-20 DIAGNOSIS — J3081 Allergic rhinitis due to animal (cat) (dog) hair and dander: Secondary | ICD-10-CM | POA: Diagnosis not present

## 2019-06-20 DIAGNOSIS — J3089 Other allergic rhinitis: Secondary | ICD-10-CM | POA: Diagnosis not present

## 2019-06-20 DIAGNOSIS — J301 Allergic rhinitis due to pollen: Secondary | ICD-10-CM | POA: Diagnosis not present

## 2019-06-22 DIAGNOSIS — J3081 Allergic rhinitis due to animal (cat) (dog) hair and dander: Secondary | ICD-10-CM | POA: Diagnosis not present

## 2019-06-22 DIAGNOSIS — J3089 Other allergic rhinitis: Secondary | ICD-10-CM | POA: Diagnosis not present

## 2019-06-22 DIAGNOSIS — J301 Allergic rhinitis due to pollen: Secondary | ICD-10-CM | POA: Diagnosis not present

## 2019-06-26 DIAGNOSIS — Z1231 Encounter for screening mammogram for malignant neoplasm of breast: Secondary | ICD-10-CM | POA: Diagnosis not present

## 2019-06-26 DIAGNOSIS — J301 Allergic rhinitis due to pollen: Secondary | ICD-10-CM | POA: Diagnosis not present

## 2019-06-26 DIAGNOSIS — J3089 Other allergic rhinitis: Secondary | ICD-10-CM | POA: Diagnosis not present

## 2019-06-26 DIAGNOSIS — J3081 Allergic rhinitis due to animal (cat) (dog) hair and dander: Secondary | ICD-10-CM | POA: Diagnosis not present

## 2019-06-26 DIAGNOSIS — Z1239 Encounter for other screening for malignant neoplasm of breast: Secondary | ICD-10-CM | POA: Diagnosis not present

## 2019-06-29 MED FILL — ALYACEN 1-35-28 TABLET: 1-35 | 84 days supply | Qty: 112 | Fill #1

## 2019-07-03 DIAGNOSIS — J329 Chronic sinusitis, unspecified: Secondary | ICD-10-CM | POA: Diagnosis not present

## 2019-07-03 DIAGNOSIS — J3089 Other allergic rhinitis: Secondary | ICD-10-CM | POA: Diagnosis not present

## 2019-07-03 DIAGNOSIS — J301 Allergic rhinitis due to pollen: Secondary | ICD-10-CM | POA: Diagnosis not present

## 2019-07-03 DIAGNOSIS — J3081 Allergic rhinitis due to animal (cat) (dog) hair and dander: Secondary | ICD-10-CM | POA: Diagnosis not present

## 2019-07-03 DIAGNOSIS — R7989 Other specified abnormal findings of blood chemistry: Secondary | ICD-10-CM | POA: Diagnosis not present

## 2019-07-03 MED FILL — DOXYCYCLINE MONOHYDRATE 100: 100 | 10 days supply | Qty: 20 | Fill #0

## 2019-08-02 ENCOUNTER — Ambulatory Visit: Payer: BLUE CROSS/BLUE SHIELD | Admitting: Obstetrics and Gynecology

## 2019-10-05 ENCOUNTER — Other Ambulatory Visit: Payer: Self-pay | Admitting: Obstetrics and Gynecology

## 2019-10-05 DIAGNOSIS — N809 Endometriosis, unspecified: Secondary | ICD-10-CM

## 2019-10-05 MED FILL — ALYACEN 1-35-28 TABLET: 1-35 | 84 days supply | Qty: 84 | Fill #0

## 2019-11-21 ENCOUNTER — Other Ambulatory Visit (HOSPITAL_COMMUNITY): Payer: Self-pay | Admitting: Allergy and Immunology

## 2019-11-21 MED FILL — LEVOCETIRIZINE 5 MG TABLET: 5 | 30 days supply | Qty: 30 | Fill #0

## 2019-11-21 MED FILL — FLUTICASONE PROP 50 MCG SPR: 50 | 60 days supply | Qty: 16 | Fill #0

## 2019-11-21 MED FILL — ARNUITY ELLIPTA 200 MCG INH: 200 | 30 days supply | Qty: 30 | Fill #0

## 2019-11-21 MED FILL — SPIRIVA RESPIMAT 1.25 MCG I: 1.25 | 30 days supply | Qty: 4 | Fill #0

## 2019-11-21 MED FILL — VENTOLIN HFA 90 MCG INHALER: 108 (90 BAS | 25 days supply | Qty: 18 | Fill #0

## 2019-11-21 MED FILL — AZELASTINE HCL 137 MCG SPRY: 0.1 | 50 days supply | Qty: 30 | Fill #0

## 2019-11-21 MED FILL — IPRATROPIUM 0.03% SPRAY: 0.03 | 43 days supply | Qty: 30 | Fill #0

## 2019-11-30 ENCOUNTER — Other Ambulatory Visit (HOSPITAL_COMMUNITY): Payer: Self-pay | Admitting: Physician Assistant

## 2020-02-13 MED FILL — ALYACEN 1-35-28 TABLET: 1-35 | 63 days supply | Qty: 84 | Fill #1

## 2020-03-11 MED FILL — DOXYCYCLINE MONOHYDRATE 100: 100 | 10 days supply | Qty: 20 | Fill #0

## 2020-04-22 MED FILL — ARNUITY ELLIPTA 200 MCG INH: 200 | 30 days supply | Qty: 30 | Fill #1

## 2020-04-22 MED FILL — ALYACEN 1-35-28 TABLET: 1-35 | 63 days supply | Qty: 84 | Fill #2

## 2020-06-25 MED FILL — ALYACEN 1-35-28 TABLET: 1-35 | 63 days supply | Qty: 84 | Fill #3

## 2020-07-16 ENCOUNTER — Other Ambulatory Visit (HOSPITAL_COMMUNITY): Payer: Self-pay | Admitting: Physician Assistant

## 2020-07-16 MED FILL — HYDROCODONE-CHLORPHEN ER SU: 10-8 | 12 days supply | Qty: 115 | Fill #0

## 2020-07-16 MED FILL — DOXYCYCLINE MONOHYDRATE 100: 100 | 10 days supply | Qty: 20 | Fill #0

## 2020-07-17 ENCOUNTER — Other Ambulatory Visit (HOSPITAL_COMMUNITY): Payer: Self-pay | Admitting: Physician Assistant

## 2020-07-17 MED FILL — BENZONATATE 100 MG CAPS: 100 | 6 days supply | Qty: 40 | Fill #0

## 2020-07-23 ENCOUNTER — Other Ambulatory Visit (HOSPITAL_COMMUNITY): Payer: Self-pay | Admitting: Physician Assistant

## 2020-07-23 MED FILL — predniSONE 20 MG TABS: 20 | 5 days supply | Qty: 10 | Fill #0

## 2020-07-23 MED FILL — HYDROCODONE-HOMATROPINE SYR: 5-1.5 | 6 days supply | Qty: 120 | Fill #0

## 2020-07-29 ENCOUNTER — Other Ambulatory Visit (HOSPITAL_COMMUNITY): Payer: Self-pay | Admitting: Allergy and Immunology

## 2020-07-29 MED FILL — CEFDINIR 300 MG CAPS: 300 | 10 days supply | Qty: 20 | Fill #0

## 2020-08-07 ENCOUNTER — Other Ambulatory Visit (HOSPITAL_COMMUNITY): Payer: Self-pay | Admitting: Physician Assistant

## 2020-08-07 MED FILL — predniSONE 20 MG TABS: 20 | 5 days supply | Qty: 10 | Fill #0

## 2020-08-08 ENCOUNTER — Other Ambulatory Visit (HOSPITAL_COMMUNITY): Payer: Self-pay | Admitting: Physician Assistant

## 2020-08-08 MED FILL — HYDROCODONE-CHLORPHEN ER SU: 10-8 | 12 days supply | Qty: 115 | Fill #0

## 2020-09-13 MED FILL — ALYACEN 1-35-28 TABLET: 1-35 | 63 days supply | Qty: 84 | Fill #4

## 2020-10-29 ENCOUNTER — Other Ambulatory Visit (HOSPITAL_COMMUNITY): Payer: Self-pay

## 2020-10-29 MED ORDER — PREDNISONE 10 MG PO TABS
ORAL_TABLET | ORAL | 0 refills | Status: AC
  Filled 2020-10-29: qty 21, 6d supply, fill #0

## 2020-10-29 MED ORDER — IPRATROPIUM-ALBUTEROL 0.5-2.5 (3) MG/3ML IN SOLN
RESPIRATORY_TRACT | 1 refills | Status: AC
  Filled 2020-10-29: qty 180, 10d supply, fill #0

## 2020-11-11 ENCOUNTER — Other Ambulatory Visit (HOSPITAL_COMMUNITY): Payer: Self-pay

## 2020-11-19 ENCOUNTER — Other Ambulatory Visit (HOSPITAL_COMMUNITY): Payer: Self-pay

## 2020-11-19 MED ORDER — ALYACEN 1/35 1-35 MG-MCG PO TABS
ORAL_TABLET | ORAL | 4 refills | Status: DC
  Filled 2020-11-19 (×2): qty 84, 63d supply, fill #0
  Filled 2021-02-10 (×2): qty 84, 63d supply, fill #1

## 2020-11-21 ENCOUNTER — Other Ambulatory Visit (HOSPITAL_COMMUNITY): Payer: Self-pay

## 2021-01-09 ENCOUNTER — Other Ambulatory Visit (HOSPITAL_COMMUNITY): Payer: Self-pay

## 2021-02-10 ENCOUNTER — Other Ambulatory Visit (HOSPITAL_COMMUNITY): Payer: Self-pay

## 2021-02-10 MED ORDER — NYLIA 1/35 1-35 MG-MCG PO TABS
ORAL_TABLET | ORAL | 3 refills | Status: AC
  Filled 2021-02-10: qty 84, 63d supply, fill #0

## 2021-03-14 ENCOUNTER — Other Ambulatory Visit (HOSPITAL_COMMUNITY): Payer: Self-pay | Admitting: Allergy and Immunology

## 2021-03-14 ENCOUNTER — Other Ambulatory Visit (HOSPITAL_COMMUNITY): Payer: Self-pay

## 2021-03-14 MED ORDER — ARNUITY ELLIPTA 200 MCG/ACT IN AEPB
INHALATION_SPRAY | RESPIRATORY_TRACT | 5 refills | Status: AC
  Filled 2021-03-14: qty 30, 30d supply, fill #0
  Filled 2021-08-11: qty 30, 30d supply, fill #1
  Filled 2021-11-10: qty 30, 30d supply, fill #2

## 2021-03-17 ENCOUNTER — Other Ambulatory Visit (HOSPITAL_COMMUNITY): Payer: Self-pay

## 2021-03-18 ENCOUNTER — Other Ambulatory Visit (HOSPITAL_COMMUNITY): Payer: Self-pay

## 2021-03-27 ENCOUNTER — Other Ambulatory Visit (HOSPITAL_COMMUNITY): Payer: Self-pay

## 2021-06-18 ENCOUNTER — Other Ambulatory Visit (HOSPITAL_COMMUNITY): Payer: Self-pay

## 2021-07-08 ENCOUNTER — Other Ambulatory Visit (HOSPITAL_COMMUNITY): Payer: Self-pay

## 2021-08-11 ENCOUNTER — Other Ambulatory Visit (HOSPITAL_COMMUNITY): Payer: Self-pay

## 2021-08-12 ENCOUNTER — Other Ambulatory Visit (HOSPITAL_COMMUNITY): Payer: Self-pay

## 2021-08-15 ENCOUNTER — Other Ambulatory Visit (HOSPITAL_COMMUNITY): Payer: Self-pay

## 2021-10-23 ENCOUNTER — Other Ambulatory Visit (HOSPITAL_COMMUNITY): Payer: Self-pay

## 2021-10-23 MED ORDER — NORTREL 1/35 (21) 1-35 MG-MCG PO TABS
ORAL_TABLET | ORAL | 4 refills | Status: AC
  Filled 2021-10-23: qty 84, 84d supply, fill #0

## 2021-11-10 ENCOUNTER — Other Ambulatory Visit (HOSPITAL_COMMUNITY): Payer: Self-pay

## 2021-11-11 ENCOUNTER — Other Ambulatory Visit (HOSPITAL_COMMUNITY): Payer: Self-pay

## 2021-11-20 ENCOUNTER — Other Ambulatory Visit (HOSPITAL_COMMUNITY): Payer: Self-pay

## 2021-11-29 ENCOUNTER — Other Ambulatory Visit (HOSPITAL_COMMUNITY): Payer: Self-pay

## 2021-12-01 ENCOUNTER — Other Ambulatory Visit (HOSPITAL_COMMUNITY): Payer: Self-pay

## 2021-12-05 ENCOUNTER — Other Ambulatory Visit (HOSPITAL_COMMUNITY): Payer: Self-pay

## 2022-01-06 ENCOUNTER — Other Ambulatory Visit (HOSPITAL_COMMUNITY): Payer: Self-pay

## 2022-01-06 MED ORDER — EPINEPHRINE 0.3 MG/0.3ML IJ SOAJ
INTRAMUSCULAR | 1 refills | Status: AC
  Filled 2022-01-06: qty 2, 30d supply, fill #0

## 2022-01-07 ENCOUNTER — Other Ambulatory Visit (HOSPITAL_COMMUNITY): Payer: Self-pay

## 2022-01-08 ENCOUNTER — Other Ambulatory Visit (HOSPITAL_COMMUNITY): Payer: Self-pay

## 2022-01-08 MED ORDER — EPINEPHRINE 0.3 MG/0.3ML IJ SOAJ
INTRAMUSCULAR | 1 refills | Status: AC
  Filled 2022-01-08: qty 2, 7d supply, fill #0

## 2022-02-06 ENCOUNTER — Other Ambulatory Visit (HOSPITAL_COMMUNITY): Payer: Self-pay

## 2022-03-30 ENCOUNTER — Other Ambulatory Visit (HOSPITAL_COMMUNITY): Payer: Self-pay

## 2022-03-30 MED ORDER — ARNUITY ELLIPTA 200 MCG/ACT IN AEPB
INHALATION_SPRAY | RESPIRATORY_TRACT | 5 refills | Status: AC
  Filled 2022-03-30: qty 30, 30d supply, fill #0
  Filled 2022-07-02: qty 30, 30d supply, fill #1

## 2022-03-30 MED ORDER — SPIRIVA RESPIMAT 1.25 MCG/ACT IN AERS
INHALATION_SPRAY | RESPIRATORY_TRACT | 5 refills | Status: AC
  Filled 2022-03-30: qty 4, 30d supply, fill #0

## 2022-03-31 ENCOUNTER — Other Ambulatory Visit (HOSPITAL_COMMUNITY): Payer: Self-pay

## 2022-06-04 ENCOUNTER — Other Ambulatory Visit (HOSPITAL_COMMUNITY): Payer: Self-pay

## 2022-07-01 ENCOUNTER — Other Ambulatory Visit: Payer: Self-pay

## 2022-07-02 ENCOUNTER — Other Ambulatory Visit (HOSPITAL_COMMUNITY): Payer: Self-pay

## 2022-08-04 ENCOUNTER — Other Ambulatory Visit (HOSPITAL_COMMUNITY): Payer: Self-pay

## 2022-08-04 MED ORDER — ARNUITY ELLIPTA 200 MCG/ACT IN AEPB
1.0000 | INHALATION_SPRAY | Freq: Every day | RESPIRATORY_TRACT | 4 refills | Status: AC
  Filled 2022-08-04: qty 30, 30d supply, fill #0
  Filled 2022-08-31: qty 30, 30d supply, fill #1
  Filled 2022-12-09: qty 30, 30d supply, fill #2
  Filled 2023-07-12 – 2023-07-30 (×2): qty 30, 30d supply, fill #3

## 2022-08-04 MED ORDER — PREDNISONE 20 MG PO TABS
ORAL_TABLET | ORAL | 0 refills | Status: AC
  Filled 2022-08-04: qty 18, 9d supply, fill #0

## 2022-08-04 MED ORDER — AMOXICILLIN-POT CLAVULANATE 875-125 MG PO TABS
1.0000 | ORAL_TABLET | Freq: Two times a day (BID) | ORAL | 0 refills | Status: AC
  Filled 2022-08-04: qty 20, 10d supply, fill #0

## 2022-08-05 ENCOUNTER — Other Ambulatory Visit (HOSPITAL_COMMUNITY): Payer: Self-pay

## 2022-08-31 ENCOUNTER — Other Ambulatory Visit (HOSPITAL_COMMUNITY): Payer: Self-pay

## 2022-09-07 ENCOUNTER — Other Ambulatory Visit (HOSPITAL_COMMUNITY): Payer: Self-pay

## 2022-09-07 MED ORDER — GLUCOSE BLOOD VI STRP
ORAL_STRIP | 5 refills | Status: AC
  Filled 2022-09-07: qty 50, 50d supply, fill #0

## 2022-09-07 MED ORDER — GLUCOSE BLOOD VI STRP
ORAL_STRIP | 5 refills | Status: AC
  Filled 2022-09-07: qty 100, 30d supply, fill #0

## 2022-09-08 ENCOUNTER — Other Ambulatory Visit (HOSPITAL_COMMUNITY): Payer: Self-pay

## 2022-09-14 ENCOUNTER — Other Ambulatory Visit (HOSPITAL_COMMUNITY): Payer: Self-pay

## 2022-09-17 ENCOUNTER — Other Ambulatory Visit (HOSPITAL_COMMUNITY): Payer: Self-pay

## 2022-12-09 ENCOUNTER — Other Ambulatory Visit (HOSPITAL_COMMUNITY): Payer: Self-pay

## 2022-12-16 ENCOUNTER — Other Ambulatory Visit (HOSPITAL_COMMUNITY): Payer: Self-pay

## 2023-01-07 ENCOUNTER — Other Ambulatory Visit (HOSPITAL_COMMUNITY): Payer: Self-pay

## 2023-01-25 ENCOUNTER — Other Ambulatory Visit (HOSPITAL_COMMUNITY): Payer: Self-pay

## 2023-01-25 MED ORDER — LEVOTHYROXINE SODIUM 25 MCG PO TABS
25.0000 ug | ORAL_TABLET | Freq: Every day | ORAL | 5 refills | Status: AC
  Filled 2023-01-25 – 2023-05-24 (×2): qty 30, 30d supply, fill #0

## 2023-02-03 ENCOUNTER — Other Ambulatory Visit (HOSPITAL_COMMUNITY): Payer: Self-pay

## 2023-04-12 ENCOUNTER — Other Ambulatory Visit (HOSPITAL_COMMUNITY): Payer: Self-pay

## 2023-04-12 MED ORDER — DOXYCYCLINE HYCLATE 100 MG PO TABS
100.0000 mg | ORAL_TABLET | Freq: Two times a day (BID) | ORAL | 0 refills | Status: AC
  Filled 2023-04-12: qty 20, 10d supply, fill #0

## 2023-04-12 MED ORDER — BENZONATATE 100 MG PO CAPS
100.0000 mg | ORAL_CAPSULE | Freq: Three times a day (TID) | ORAL | 0 refills | Status: AC | PRN
  Filled 2023-04-12: qty 40, 8d supply, fill #0

## 2023-04-26 ENCOUNTER — Other Ambulatory Visit (HOSPITAL_COMMUNITY): Payer: Self-pay

## 2023-04-26 MED ORDER — AZITHROMYCIN 500 MG PO TABS
ORAL_TABLET | ORAL | 0 refills | Status: AC
  Filled 2023-04-26: qty 7, 7d supply, fill #0

## 2023-05-24 ENCOUNTER — Other Ambulatory Visit (HOSPITAL_COMMUNITY): Payer: Self-pay

## 2023-05-25 ENCOUNTER — Other Ambulatory Visit: Payer: Self-pay

## 2023-05-27 ENCOUNTER — Other Ambulatory Visit: Payer: Self-pay

## 2023-05-28 ENCOUNTER — Other Ambulatory Visit: Payer: Self-pay

## 2023-06-02 ENCOUNTER — Other Ambulatory Visit: Payer: Self-pay

## 2023-06-11 ENCOUNTER — Other Ambulatory Visit: Payer: Self-pay

## 2023-07-12 ENCOUNTER — Other Ambulatory Visit (HOSPITAL_BASED_OUTPATIENT_CLINIC_OR_DEPARTMENT_OTHER): Payer: Self-pay

## 2023-07-12 ENCOUNTER — Other Ambulatory Visit (HOSPITAL_COMMUNITY): Payer: Self-pay

## 2023-07-12 MED ORDER — AZELASTINE HCL 0.1 % NA SOLN
2.0000 | Freq: Two times a day (BID) | NASAL | 5 refills | Status: AC | PRN
  Filled 2023-07-12: qty 30, 30d supply, fill #0
  Filled 2023-07-30: qty 30, 50d supply, fill #0

## 2023-07-27 ENCOUNTER — Other Ambulatory Visit (HOSPITAL_COMMUNITY): Payer: Self-pay

## 2023-07-30 ENCOUNTER — Other Ambulatory Visit (HOSPITAL_COMMUNITY): Payer: Self-pay

## 2023-08-11 ENCOUNTER — Other Ambulatory Visit (HOSPITAL_COMMUNITY): Payer: Self-pay

## 2023-08-12 ENCOUNTER — Other Ambulatory Visit (HOSPITAL_COMMUNITY): Payer: Self-pay

## 2023-09-27 ENCOUNTER — Other Ambulatory Visit (HOSPITAL_COMMUNITY): Payer: Self-pay

## 2023-09-27 MED ORDER — EPINEPHRINE 0.3 MG/0.3ML IJ SOAJ
INTRAMUSCULAR | 1 refills | Status: AC
  Filled 2023-09-27: qty 2, 30d supply, fill #0
  Filled 2023-09-27: qty 2, 2d supply, fill #0

## 2024-01-05 ENCOUNTER — Other Ambulatory Visit: Payer: Self-pay

## 2024-06-08 ENCOUNTER — Other Ambulatory Visit (HOSPITAL_COMMUNITY): Payer: Self-pay

## 2024-06-08 MED ORDER — METFORMIN HCL ER 500 MG PO TB24
500.0000 mg | ORAL_TABLET | Freq: Every day | ORAL | 3 refills | Status: AC
  Filled 2024-06-08: qty 90, 90d supply, fill #0

## 2024-06-18 ENCOUNTER — Other Ambulatory Visit (HOSPITAL_COMMUNITY): Payer: Self-pay
